# Patient Record
Sex: Male | Born: 1995
Health system: Southern US, Community
[De-identification: ages and names within clinical notes are randomized; demographics above are authoritative.]

## PROBLEM LIST (undated history)

## (undated) DIAGNOSIS — I10 Essential (primary) hypertension: Secondary | ICD-10-CM

## (undated) HISTORY — PX: TONSILLECTOMY: SUR1361

---
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
## 2016-05-17 ENCOUNTER — Emergency Department (HOSPITAL_COMMUNITY)
Admission: EM | Admit: 2016-05-17 | Discharge: 2016-05-17 | Disposition: A | Discharge status: Home / Self Care | Payer: 59 | Attending: Emergency Medicine | Admitting: Emergency Medicine

## 2016-05-17 ENCOUNTER — Encounter (HOSPITAL_COMMUNITY): Payer: Self-pay | Admitting: Cardiology

## 2016-05-17 ENCOUNTER — Emergency Department (HOSPITAL_COMMUNITY): Payer: 59

## 2016-05-17 DIAGNOSIS — Z79899 Other long term (current) drug therapy: Secondary | ICD-10-CM | POA: Diagnosis not present

## 2016-05-17 DIAGNOSIS — F17210 Nicotine dependence, cigarettes, uncomplicated: Secondary | ICD-10-CM | POA: Diagnosis not present

## 2016-05-17 DIAGNOSIS — J208 Acute bronchitis due to other specified organisms: Secondary | ICD-10-CM | POA: Insufficient documentation

## 2016-05-17 DIAGNOSIS — R109 Unspecified abdominal pain: Secondary | ICD-10-CM | POA: Insufficient documentation

## 2016-05-17 DIAGNOSIS — R079 Chest pain, unspecified: Secondary | ICD-10-CM | POA: Diagnosis not present

## 2016-05-17 DIAGNOSIS — I10 Essential (primary) hypertension: Secondary | ICD-10-CM | POA: Insufficient documentation

## 2016-05-17 LAB — CBC
HEMATOCRIT: 43.4 % (ref 39.0–52.0)
Hemoglobin: 15.7 g/dL (ref 13.0–17.0)
MCH: 32.5 pg (ref 26.0–34.0)
MCHC: 36.2 g/dL — ABNORMAL HIGH (ref 30.0–36.0)
MCV: 89.9 fL (ref 78.0–100.0)
Platelets: 186 10*3/uL (ref 150–400)
RBC: 4.83 MIL/uL (ref 4.22–5.81)
RDW: 12.2 % (ref 11.5–15.5)
WBC: 5.1 10*3/uL (ref 4.0–10.5)

## 2016-05-17 LAB — BASIC METABOLIC PANEL
Anion gap: 7 (ref 5–15)
BUN: 14 mg/dL (ref 6–20)
CHLORIDE: 104 mmol/L (ref 101–111)
CO2: 27 mmol/L (ref 22–32)
Calcium: 9.5 mg/dL (ref 8.9–10.3)
Creatinine, Ser: 0.86 mg/dL (ref 0.61–1.24)
GFR calc Af Amer: >60 mL/min (ref >60)
GFR calc non Af Amer: >60 mL/min (ref >60)
GLUCOSE: 101 mg/dL — AB (ref 65–99)
POTASSIUM: 3.8 mmol/L (ref 3.5–5.1)
SODIUM: 138 mmol/L (ref 135–145)

## 2016-05-17 LAB — HEPATIC FUNCTION PANEL
ALK PHOS: 76 U/L (ref 38–126)
ALT: 34 U/L (ref 17–63)
AST: 29 U/L (ref 15–41)
Albumin: 4.6 g/dL (ref 3.5–5.0)
BILIRUBIN INDIRECT: 0.5 mg/dL (ref 0.3–0.9)
Bilirubin, Direct: 0.1 mg/dL (ref 0.1–0.5)
TOTAL PROTEIN: 7.6 g/dL (ref 6.5–8.1)
Total Bilirubin: 0.6 mg/dL (ref 0.3–1.2)

## 2016-05-17 LAB — I-STAT TROPONIN, ED: Troponin i, poc: 0 ng/mL (ref 0.00–0.08)

## 2016-05-17 LAB — LIPASE, BLOOD: Lipase: 19 U/L (ref 11–51)

## 2016-05-17 MED ORDER — ACETAMINOPHEN 325 MG PO TABS
650.0000 mg | ORAL_TABLET | Freq: Once | ORAL | Status: AC
  Administered 2016-05-17: 650 mg via ORAL
  Filled 2016-05-17: qty 2

## 2016-05-17 MED ORDER — HYDRALAZINE HCL 20 MG/ML IJ SOLN: 5.0000 mg | Freq: Once | INTRAMUSCULAR | Status: DC

## 2016-05-17 MED ORDER — AZITHROMYCIN 250 MG PO TABS: ORAL_TABLET | ORAL | 0 refills | Status: DC

## 2016-05-17 MED ORDER — BENZONATATE 100 MG PO CAPS: 200.0000 mg | ORAL_CAPSULE | Freq: Three times a day (TID) | ORAL | 0 refills | Status: DC | PRN

## 2016-05-17 NOTE — Discharge Instructions (Signed)
Take the medicines prescribed for your suspected acute bronchitis.  Tylenol and heating pad may help with your chest wall pain.  You need to take your blood pressure medication as it was dangerously elevated when you first arrived.  Please follow up with your doctor for a blood pressure recheck within 1 week.

## 2016-05-17 NOTE — ED Triage Notes (Signed)
Left sided chest pain,  And jaw pain.  Bilateral arm and leg pain.  Coughing.

## 2016-05-17 NOTE — ED Provider Notes (Signed)
[facility] Provider Note   CSN: [id number] Arrival date & time: 05/17/16  4098     History   Chief Complaint Chief Complaint  Patient presents with  . Generalized Body Aches  . Chest Pain    HPI Benjamin Chang is a 21 y.o. male presenting with chest pain associated with coughing up thick green to black sputum, but not bloody (works as a Psychologist, occupational) along with generalized body aches and nasal congestion since yesterday.  His chest pain is localized to the left chest and radiates into his neck and jaw and also radiates to his back.  It is worsened with cough and with palpation of his chest.  He denies sob, leg cramping or swelling.  No vomiting but endorses mild nausea and upper abdominal discomfort. He has had no medicines prior to arriving here.  He is a 1/4 ppd smoker.  He is supposed to take bp medication which he has not taken since last summer, took a dose of his losartan last night when his chest pain started.  The history is provided by the patient.    Past Medical History:  Diagnosis Date  . Hypertension     Patient Active Problem List   Diagnosis Date Noted  . Acute appendicitis with localized peritonitis 11/04/2015    Past Surgical History:  Procedure Laterality Date  . LAPAROSCOPIC APPENDECTOMY N/A 11/04/2015   Procedure: APPENDECTOMY LAPAROSCOPIC;  Surgeon: Ancil Linsey, MD;  Location: [facility] ORS;  Service: General;  Laterality: N/A;  . TONSILLECTOMY         Home Medications    Prior to Admission medications   Medication Sig Start Date End Date Taking? Authorizing Provider  amLODipine (NORVASC) 5 MG tablet Take 5 mg by mouth daily.   Yes Historical Provider, MD  losartan (COZAAR) 25 MG tablet Take 25 mg by mouth daily.   Yes Historical Provider, MD  azithromycin (ZITHROMAX Z-PAK) 250 MG tablet Take 2 tablets by mouth on day one followed by one tablet daily for 4 days. 05/17/16   Burgess Amor, PA-C  benzonatate (TESSALON) 100 MG capsule Take 2 capsules  (200 mg total) by mouth 3 (three) times daily as needed. 05/17/16   Burgess Amor, PA-C    Family History Family History  Problem Relation Age of Onset  . Heart disease Other     Social History Social History  Substance Use Topics  . Smoking status: Current Some Day Smoker    Packs/day: 0.25    Types: Cigarettes  . Smokeless tobacco: Never Used  . Alcohol use No     Allergies   Lisinopril   Review of Systems Review of Systems  Constitutional: Negative for fever.  HENT: Positive for congestion. Negative for sore throat.   Eyes: Negative.   Respiratory: Positive for cough. Negative for chest tightness and shortness of breath.   Cardiovascular: Positive for chest pain.  Gastrointestinal: Positive for abdominal pain and nausea. Negative for constipation, diarrhea and vomiting.  Genitourinary: Negative.   Musculoskeletal: Positive for myalgias. Negative for arthralgias, joint swelling and neck pain.  Skin: Negative.  Negative for rash and wound.  Neurological: Negative for dizziness, weakness, light-headedness, numbness and headaches.  Psychiatric/Behavioral: Negative.      Physical Exam Updated Vital Signs BP (!) 151/100   Pulse 73   Temp 98.2 F (36.8 C) (Oral)   Resp 16   Ht  (1.905 m)   Wt 102.1 kg   SpO2 97%   BMI 28.12 kg/m  Physical Exam  Constitutional: He appears well-developed and well-nourished.  HENT:  Head: Normocephalic and atraumatic.  Eyes: Conjunctivae are normal.  Neck: Normal range of motion.  Cardiovascular: Normal rate, regular rhythm, normal heart sounds and intact distal pulses.  Exam reveals no friction rub.   No murmur heard. No ankle or calf edema.  nontender.  Pulmonary/Chest: Effort normal and breath sounds normal. He has no decreased breath sounds. He has no wheezes. He has no rhonchi. He has no rales. He exhibits tenderness.    ttp left chest wall low, mid clavicular line.    Abdominal: Soft. Bowel sounds are normal. There  is no tenderness.  Musculoskeletal: Normal range of motion.  Neurological: He is alert.  Skin: Skin is warm and dry.  Psychiatric: He has a normal mood and affect.  Nursing note and vitals reviewed.    ED Treatments / Results  Labs (all labs ordered are listed, but only abnormal results are displayed) Labs Reviewed  BASIC METABOLIC PANEL - Abnormal; Notable for the following:       Result Value   Glucose, Bld 101 (*)    All other components within normal limits  CBC - Abnormal; Notable for the following:    MCHC 36.2 (*)    All other components within normal limits  HEPATIC FUNCTION PANEL  LIPASE, BLOOD  I-STAT TROPOININ, ED    EKG  EKG Interpretation None      ED ECG REPORT   Date: 05/17/2016  Rate: 87  Rhythm: normal sinus rhythm  QRS Axis: normal  Intervals: normal  ST/T Wave abnormalities: normal  Conduction Disutrbances:none  Narrative Interpretation:   Old EKG Reviewed: unchanged  I have personally reviewed the EKG tracing and agree with the computerized printout as noted.   Radiology Dg Chest 2 View  Result Date: 05/17/2016 CLINICAL DATA:  Left-sided chest pain EXAM: CHEST  2 VIEW COMPARISON:  03/01/2015 FINDINGS: The heart size and mediastinal contours are within normal limits. Both lungs are clear. The visualized skeletal structures are unremarkable. IMPRESSION: No active cardiopulmonary disease. Electronically Signed   By: Alcide Clever M.D.   On: 05/17/2016 08:13    Procedures Procedures (including critical care time)  Medications Ordered in ED Medications  acetaminophen (TYLENOL) tablet 650 mg (650 mg Oral Given 05/17/16 0856)     Initial Impression / Assessment and Plan / ED Course  I have reviewed the triage vital signs and the nursing notes.  Pertinent labs & imaging results that were available during my care of the patient were reviewed by me and considered in my medical decision making (see chart for details).     Pt with left sided  chest wall pain along with cough and myalgias.  HTN, noncompliant with meds.  ekg normal, troponin negative.  Suspect acute bronchitis in setting of uncontrolled HTN.   He was placed on tessalon, zithromax for bronchitis.  He was strongly encouraged to not skip his doses of his bp medicine, discussed risks of uncontrolled bp.    Final Clinical Impressions(s) / ED Diagnoses   Final diagnoses:  Acute bronchitis due to other specified organisms  Essential hypertension    New Prescriptions New Prescriptions   AZITHROMYCIN (ZITHROMAX Z-PAK) 250 MG TABLET    Take 2 tablets by mouth on day one followed by one tablet daily for 4 days.   BENZONATATE (TESSALON) 100 MG CAPSULE    Take 2 capsules (200 mg total) by mouth 3 (three) times daily as needed.  Burgess Amor, PA-C 05/17/16 1011    Bethann Berkshire, MD 05/17/16 [phone]

## 2016-05-17 NOTE — ED Notes (Signed)
Pt states he feels better at this time.  b/p 151/100.  Per MD ok to hold hydralzine.

## 2016-05-19 DIAGNOSIS — I10 Essential (primary) hypertension: Secondary | ICD-10-CM | POA: Diagnosis not present

## 2016-05-19 DIAGNOSIS — J309 Allergic rhinitis, unspecified: Secondary | ICD-10-CM | POA: Diagnosis not present

## 2016-05-19 DIAGNOSIS — R05 Cough: Secondary | ICD-10-CM | POA: Diagnosis not present

## 2016-07-21 MED FILL — LOSARTAN-HCTZ 100-25 MG TAB: 100-25 | 90 days supply | Qty: 90 | Fill #0

## 2016-07-21 MED FILL — AMLODIPINE BESYLATE 10 MG T: 10 | 90 days supply | Qty: 90 | Fill #0

## 2016-07-31 ENCOUNTER — Ambulatory Visit (INDEPENDENT_AMBULATORY_CARE_PROVIDER_SITE_OTHER): Payer: 59 | Admitting: Otolaryngology

## 2016-07-31 DIAGNOSIS — H6983 Other specified disorders of Eustachian tube, bilateral: Secondary | ICD-10-CM | POA: Diagnosis not present

## 2016-08-17 DIAGNOSIS — S9031XA Contusion of right foot, initial encounter: Secondary | ICD-10-CM | POA: Diagnosis not present

## 2016-08-17 DIAGNOSIS — Z68.41 Body mass index (BMI) pediatric, 85th percentile to less than 95th percentile for age: Secondary | ICD-10-CM | POA: Diagnosis not present

## 2016-09-23 DIAGNOSIS — R05 Cough: Secondary | ICD-10-CM | POA: Diagnosis not present

## 2016-09-23 DIAGNOSIS — R079 Chest pain, unspecified: Secondary | ICD-10-CM | POA: Diagnosis not present

## 2016-09-23 DIAGNOSIS — J069 Acute upper respiratory infection, unspecified: Secondary | ICD-10-CM | POA: Diagnosis not present

## 2016-10-24 ENCOUNTER — Encounter: Payer: Self-pay | Admitting: *Deleted

## 2016-10-25 ENCOUNTER — Ambulatory Visit (INDEPENDENT_AMBULATORY_CARE_PROVIDER_SITE_OTHER): Payer: 59 | Admitting: Cardiovascular Disease

## 2016-10-25 ENCOUNTER — Encounter: Payer: Self-pay | Admitting: Cardiovascular Disease

## 2016-10-25 VITALS — BP 148/98 | HR 78 | Ht 75.0 in | Wt 218.0 lb

## 2016-10-25 DIAGNOSIS — R61 Generalized hyperhidrosis: Secondary | ICD-10-CM

## 2016-10-25 DIAGNOSIS — R079 Chest pain, unspecified: Secondary | ICD-10-CM

## 2016-10-25 DIAGNOSIS — I10 Essential (primary) hypertension: Secondary | ICD-10-CM | POA: Diagnosis not present

## 2016-10-25 DIAGNOSIS — R002 Palpitations: Secondary | ICD-10-CM | POA: Diagnosis not present

## 2016-10-25 NOTE — Patient Instructions (Signed)
Medication Instructions:  Continue all current medications.  Labwork: BMET, plasma aldosterone renin ratio, plasma metanephrines - orders given today.  Testing/Procedures:  CT angio of chest with contrast   Your physician has requested that you have an echocardiogram. Echocardiography is a painless test that uses sound waves to create images of your heart. It provides your doctor with information about the size and shape of your heart and how well your heart's chambers and valves are working. This procedure takes approximately one hour. There are no restrictions for this procedure.  Follow-Up:  Office will contact with results via phone or letter.    6 weeks   Any Other Special Instructions Will Be Listed Below (If Applicable).  If you need a refill on your cardiac medications before your next appointment, please call your pharmacy.

## 2016-10-25 NOTE — Progress Notes (Signed)
CARDIOLOGY CONSULT NOTE  Patient ID: Benjamin Chang MRN: [id number] DOB/AGE: 1995-02-11 21 y.o.  Admit date: (Not on file) Primary Physician: Selinda Flavin, MD Referring Physician: Dimas Aguas  Reason for Consultation: chest pain  HPI: Benjamin Chang is a 21 y.o. male who is being seen today for the evaluation of chest pain at the request of Selinda Flavin, MD.   He has a history of hypertension and GERD.  I reviewed PCP notes. It appears he has been treated for hypertension for several years. He has been reporting chest pain for the past few weeks and was apparently also evaluated in April 2018 at [facility] for chest pain. It was nonexertional. PCP performed workup which included a chest x-ray, CBC, and d-dimer, as well as basic metabolic panel and all were normal. ECG was also normal. He was started on omeprazole.  Echocardiogram 09/30/14 demonstrated normal left ventricular systolic function and normal diastolic function, LVEF 60-65%.  I personally reviewed the ECG performed on 05/17/16 which was normal.  Chest x-ray was also normal.  His chest pain in April 2018 was associated with coughing and productive sputum. He works as a Psychologist, occupational. It was presumed he had acute bronchitis and was given antibiotics.  Basic metabolic, CBC, hepatic function panel, lipase, and i-STAT troponin were normal.  He is here with his mother who works in the cancer center at [facility] and also works in Dr. Alonza Smoker office.  He was apparently diagnosed with hypertension during a school physical before he entered the ninth grade. He has had recalcitrant hypertension ever since. His mother tells me lisinopril controlled his blood pressures but he developed angioedema. He is currently on a calcium channel blocker, angiotensin receptor blocker, and thiazide diuretic.  He has been experiencing chest pain over the last several months. It can occur both with and without exertion. The most  severe instance occurred when he was at rest. He said it brought him to his knees and he was in tears. It radiates to the left shoulder and left arm and left side of his neck.  He has a history of palpitations associated with diaphoresis.  He denies leg swelling, orthopnea, and syncope.  He is also had headaches since the ninth grade.  He has an older brother who does not have hypertension. Hypertension does apparently run in his family.   Allergies  Allergen Reactions  . Lisinopril     Lips swell    Current Outpatient Prescriptions  Medication Sig Dispense Refill  . amLODipine (NORVASC) 10 MG tablet Take 10 mg by mouth daily.    . cetirizine (ZYRTEC) 10 MG tablet Take 10 mg by mouth daily.    Marland Kitchen losartan-hydrochlorothiazide (HYZAAR) 100-25 MG tablet Take 1 tablet by mouth daily.    Marland Kitchen omeprazole (PRILOSEC) 20 MG capsule Take 20 mg by mouth daily.    Marland Kitchen UNABLE TO FIND Med Name:HBP SUPPORT     No current facility-administered medications for this visit.     Past Medical History:  Diagnosis Date  . Hypertension     Past Surgical History:  Procedure Laterality Date  . LAPAROSCOPIC APPENDECTOMY N/A 11/04/2015   Procedure: APPENDECTOMY LAPAROSCOPIC;  Surgeon: Ancil Linsey, MD;  Location: [facility];  Service: General;  Laterality: N/A;  . TONSILLECTOMY      Social History   Social History  . Marital status: Single    Spouse name: N/A  . Number of children: N/A  . Years of  education: N/A   Occupational History  . Construction    Social History Main Topics  . Smoking status: Current Some Day Smoker    Packs/day: 0.25    Types: Cigarettes  . Smokeless tobacco: Never Used  . Alcohol use No  . Drug use: No  . Sexual activity: Not on file   Other Topics Concern  . Not on file   Social History Narrative   ** Merged History Encounter **         No family history of premature CAD in 1st degree relatives.  Current Meds  Medication Sig  . amLODipine (NORVASC) 10  MG tablet Take 10 mg by mouth daily.  . cetirizine (ZYRTEC) 10 MG tablet Take 10 mg by mouth daily.  Marland Kitchen losartan-hydrochlorothiazide (HYZAAR) 100-25 MG tablet Take 1 tablet by mouth daily.  Marland Kitchen omeprazole (PRILOSEC) 20 MG capsule Take 20 mg by mouth daily.  Marland Kitchen UNABLE TO FIND Med Name:HBP SUPPORT  . [DISCONTINUED] amLODipine (NORVASC) 5 MG tablet Take 5 mg by mouth daily.      Review of systems complete and found to be negative unless listed above in HPI    Physical exam Blood pressure (!) 148/98, pulse 78, height  (1.905 m), weight 218 lb (98.9 kg), SpO2 98 %. General: NAD Neck: No JVD, no thyromegaly or thyroid nodule.  Lungs: Clear to auscultation bilaterally with normal respiratory effort. CV: Nondisplaced PMI. Regular rate and rhythm, normal S1/S2, no S3/S4, no murmur.  No peripheral edema.  No carotid bruit.    Abdomen: Soft, nontender, no distention.  Skin: Intact without lesions or rashes.  Neurologic: Alert and oriented x 3.  Psych: Normal affect. Extremities: No clubbing or cyanosis.  HEENT: Normal.   ECG: Most recent ECG reviewed.   Labs: Lab Results  Component Value Date/Time   K 3.8 05/17/2016 07:51 AM   BUN 14 05/17/2016 07:51 AM   CREATININE 0.86 05/17/2016 07:51 AM   ALT 34 05/17/2016 07:51 AM   HGB 15.7 05/17/2016 07:51 AM     Lipids: No results found for: LDLCALC, LDLDIRECT, CHOL, TRIG, HDL      ASSESSMENT AND PLAN:  1. Accelerated hypertension with a history of chest pain, palpitations, headaches, and diaphoresis: While hypertension can be seen in young teenagers, it is still unusual. Given his history of associated headaches, palpitations, and diaphoresis, I want to screen for hyperaldosteronism and pheochromocytoma. I will obtain plasma aldosterone and plasma renin ratio and plasma metanephrines. I would consider renal artery Dopplers to evaluate for renal artery stenosis in the future.  2. Chest pain: I find no obvious abnormalities on physical  examination. His pretest probability of ischemic heart disease is exceedingly low. I would like to screen for arterial abnormalities such as coarctation of the aorta which would also cause hypertension. I will obtain a CT angiogram of the chest. I will also obtain an echocardiogram to evaluate cardiac structure and function. His pain may be due to episodes of severe hypertension, for which I am obtaining the studies mentioned above.     Disposition: Follow up in 6 weeks.  Signed: Prentice Docker, M.D., F.A.C.C.  10/25/2016, 9:29 AM

## 2016-10-31 ENCOUNTER — Other Ambulatory Visit (HOSPITAL_COMMUNITY)
Admission: RE | Admit: 2016-10-31 | Discharge: 2016-10-31 | Disposition: A | Discharge status: Home / Self Care | Payer: 59 | Source: Ambulatory Visit | Attending: Cardiovascular Disease | Admitting: Cardiovascular Disease

## 2016-10-31 ENCOUNTER — Ambulatory Visit (HOSPITAL_COMMUNITY)
Admission: RE | Admit: 2016-10-31 | Discharge: 2016-10-31 | Disposition: A | Discharge status: Home / Self Care | Payer: 59 | Source: Ambulatory Visit | Attending: Cardiovascular Disease | Admitting: Cardiovascular Disease

## 2016-10-31 DIAGNOSIS — I200 Unstable angina: Secondary | ICD-10-CM | POA: Insufficient documentation

## 2016-10-31 DIAGNOSIS — I371 Nonrheumatic pulmonary valve insufficiency: Secondary | ICD-10-CM | POA: Diagnosis not present

## 2016-10-31 DIAGNOSIS — R079 Chest pain, unspecified: Secondary | ICD-10-CM | POA: Insufficient documentation

## 2016-10-31 DIAGNOSIS — F17200 Nicotine dependence, unspecified, uncomplicated: Secondary | ICD-10-CM | POA: Diagnosis not present

## 2016-10-31 DIAGNOSIS — I10 Essential (primary) hypertension: Secondary | ICD-10-CM | POA: Insufficient documentation

## 2016-10-31 DIAGNOSIS — K219 Gastro-esophageal reflux disease without esophagitis: Secondary | ICD-10-CM | POA: Diagnosis not present

## 2016-10-31 LAB — BASIC METABOLIC PANEL
ANION GAP: 12 (ref 5–15)
BUN: 15 mg/dL (ref 6–20)
CALCIUM: 9.5 mg/dL (ref 8.9–10.3)
CO2: 23 mmol/L (ref 22–32)
Chloride: 102 mmol/L (ref 101–111)
Creatinine, Ser: 1.01 mg/dL (ref 0.61–1.24)
GLUCOSE: 118 mg/dL — AB (ref 65–99)
POTASSIUM: 3.7 mmol/L (ref 3.5–5.1)
SODIUM: 137 mmol/L (ref 135–145)

## 2016-10-31 MED ORDER — IOPAMIDOL (ISOVUE-370) INJECTION 76%
100.0000 mL | Freq: Once | INTRAVENOUS | Status: AC | PRN
  Administered 2016-10-31: 100 mL via INTRAVENOUS

## 2016-10-31 MED FILL — OMEPRAZOLE 20 MG CAP: 20 | 90 days supply | Qty: 90 | Fill #0

## 2016-10-31 NOTE — Progress Notes (Signed)
*  PRELIMINARY RESULTS* Echocardiogram 2D Echocardiogram has been performed.  Jeryl Columbia 10/31/2016, 2:44 PM

## 2016-11-01 ENCOUNTER — Telehealth: Payer: Self-pay | Admitting: *Deleted

## 2016-11-01 NOTE — Telephone Encounter (Signed)
Notes recorded by Lesle Chris, LPN on 04/29/4008 at 10:31 AM EDT Patient notified. Copy to pmd. Follow up scheduled for 12-08-2016 with Dr. Purvis Sheffield in [place] office.

## 2016-11-01 NOTE — Telephone Encounter (Signed)
BMET -   Notes recorded by Laqueta Linden, MD on 10/31/2016 at 11:20 AM EDT Ok.  CT ANGIO -   Notes recorded by Laqueta Linden, MD on 11/01/2016 at 8:45 AM EDT Normal.  ECHO -   Notes recorded by Laqueta Linden, MD on 10/31/2016 at 3:06 PM EDT Normal cardiac function.

## 2016-11-02 LAB — METANEPHRINES, PLASMA
METANEPHRINE FREE: 39 pg/mL (ref 0–62)
Normetanephrine, Free: 94 pg/mL (ref 0–145)

## 2016-11-04 LAB — ALDOSTERONE + RENIN ACTIVITY W/ RATIO
ALDO / PRA Ratio: 1 (ref 0.0–30.0)
Aldosterone: 5.3 ng/dL (ref 0.0–30.0)
PRA LC/MS/MS: 5.218 ng/mL/hr (ref 0.167–5.380)

## 2016-11-08 ENCOUNTER — Telehealth: Payer: Self-pay | Admitting: *Deleted

## 2016-11-08 NOTE — Telephone Encounter (Signed)
Notes recorded by Lesle Chris, LPN on 16/02/958 at 5:01 PM EDT Patient notified. Copy to pmd. Follow up scheduled for 12/08/2016 in [place] office with Dr. Purvis Sheffield. ------  Notes recorded by Laqueta Linden, MD on 11/06/2016 at 8:17 AM EDT Within normal range.

## 2016-12-08 ENCOUNTER — Ambulatory Visit (INDEPENDENT_AMBULATORY_CARE_PROVIDER_SITE_OTHER): Payer: 59 | Admitting: Cardiovascular Disease

## 2016-12-08 ENCOUNTER — Encounter: Payer: Self-pay | Admitting: Cardiovascular Disease

## 2016-12-08 VITALS — BP 136/88 | HR 71 | Ht 79.0 in | Wt 225.0 lb

## 2016-12-08 DIAGNOSIS — R079 Chest pain, unspecified: Secondary | ICD-10-CM | POA: Diagnosis not present

## 2016-12-08 DIAGNOSIS — I10 Essential (primary) hypertension: Secondary | ICD-10-CM

## 2016-12-08 DIAGNOSIS — Z7182 Exercise counseling: Secondary | ICD-10-CM

## 2016-12-08 DIAGNOSIS — Z713 Dietary counseling and surveillance: Secondary | ICD-10-CM | POA: Diagnosis not present

## 2016-12-08 DIAGNOSIS — Z79899 Other long term (current) drug therapy: Secondary | ICD-10-CM | POA: Diagnosis not present

## 2016-12-08 DIAGNOSIS — R61 Generalized hyperhidrosis: Secondary | ICD-10-CM | POA: Diagnosis not present

## 2016-12-08 DIAGNOSIS — R002 Palpitations: Secondary | ICD-10-CM

## 2016-12-08 MED ORDER — SPIRONOLACTONE 25 MG PO TABS
12.5000 mg | ORAL_TABLET | Freq: Every day | ORAL | 3 refills | Status: DC
Start: 2016-12-08 — End: 2017-03-19

## 2016-12-08 MED FILL — SPIRONOLACTONE 25 MG TABLET: 25 | 90 days supply | Qty: 45 | Fill #0

## 2016-12-08 NOTE — Patient Instructions (Signed)
Your physician recommends that you schedule a follow-up appointment in:  3 months with Dr. Purvis SheffieldKoneswaran    START Spironolactone  12.5 mg daily   Get lab work :BMET in 1 week after starting medication    After 1 week of being on medication, start recording BP, 3 times a week on BP log provided for you. Please do this for a month and then return log to office for review     Thank you for choosing [facility] !

## 2016-12-08 NOTE — Progress Notes (Signed)
SUBJECTIVE: The patient returns for follow-up after undergoing cardiovascular testing performed for the evaluation of chest pain and accelerated hypertension.  CT angiogram of the chest showed no acute abnormalities.  Echocardiogram demonstrated normal left ventricular systolic function, diastolic function and regional wall motion, LVEF 60-65%.  There was mild to moderate LVH.  Plasma metanephrines and aldosterone/renin ratio were normal.  He continues to have constant chest pains.  It is not necessarily associated with exertion.  We talked about his diet and it appears that he eats quite a bit of salt.  He does not eat breakfast, eats fast food for lunch, and foods prepared at home the evening are sodium laden.  His mother corroborates this.  He does not exercise either.  He is here with his mother who works in the cancer center at [facility] and also works in Dr. Alonza Smoker office.   Review of Systems: As per "subjective", otherwise negative.  Allergies  Allergen Reactions  . Lisinopril     Lips swell    Current Outpatient Prescriptions  Medication Sig Dispense Refill  . amLODipine (NORVASC) 10 MG tablet Take 10 mg by mouth daily.    . cetirizine (ZYRTEC) 10 MG tablet Take 10 mg by mouth daily.    Marland Kitchen losartan-hydrochlorothiazide (HYZAAR) 100-25 MG tablet Take 1 tablet by mouth daily.    Marland Kitchen omeprazole (PRILOSEC) 20 MG capsule Take 20 mg by mouth daily.    Marland Kitchen UNABLE TO FIND Med Name:HBP SUPPORT     No current facility-administered medications for this visit.     Past Medical History:  Diagnosis Date  . Hypertension     Past Surgical History:  Procedure Laterality Date  . LAPAROSCOPIC APPENDECTOMY N/A 11/04/2015   Procedure: APPENDECTOMY LAPAROSCOPIC;  Surgeon: Ancil Linsey, MD;  Location: [facility];  Service: General;  Laterality: N/A;  . TONSILLECTOMY      Social History   Social History  . Marital status: Single    Spouse name: N/A  . Number of  children: N/A  . Years of education: N/A   Occupational History  . Construction    Social History Main Topics  . Smoking status: Current Some Day Smoker    Packs/day: 0.25    Types: Cigarettes  . Smokeless tobacco: Never Used  . Alcohol use No  . Drug use: No  . Sexual activity: Not on file   Other Topics Concern  . Not on file   Social History Narrative   ** Merged History Encounter **         Vitals:   12/08/16 1445  BP: 136/88  Pulse: 71  SpO2: 97%  Weight: 225 lb (102.1 kg)  Height: 6\' 7"  (2.007 m)    Wt Readings from Last 3 Encounters:  12/08/16 225 lb (102.1 kg)  10/25/16 218 lb (98.9 kg)  05/17/16 225 lb (102.1 kg) (98 %, Z= 1.97)*   * Growth percentiles are based on CDC 2-20 Years data.     PHYSICAL EXAM General: NAD HEENT: Normal. Neck: No JVD, no thyromegaly. Lungs: Clear to auscultation bilaterally with normal respiratory effort. CV: Regular rate and rhythm, normal S1/S2, no S3/S4, no murmur. No pretibial or periankle edema.  Abdomen: Soft, nontender, no distention.  Neurologic: Alert and oriented.  Psych: Normal affect. Skin: Normal. Musculoskeletal: No gross deformities.    ECG: Most recent ECG reviewed.   Labs: Lab Results  Component Value Date/Time   K 3.7 10/31/2016 10:10 AM   BUN 15  10/31/2016 10:10 AM   CREATININE 1.01 10/31/2016 10:10 AM   ALT 34 05/17/2016 07:51 AM   HGB 15.7 05/17/2016 07:51 AM     Lipids: No results found for: LDLCALC, LDLDIRECT, CHOL, TRIG, HDL     ASSESSMENT AND PLAN:  1. Accelerated hypertension with a history of chest pain, palpitations, headaches, and diaphoresis: While hypertension can be seen in young teenagers, it is still unusual.   That being said, plasma aldosterone and plasma renin ratio and plasma metanephrines were normal.  I would consider renal artery Dopplers to evaluate for renal artery stenosis in the future. For now, I will start low-dose spironolactone 12.5 mg daily.  We will check  a basic metabolic panel in 1 week. I have asked the patient to check blood pressure readings 3-4 times per week, at different times throughout the day, in order to get a better approximation of mean BP values. These results will be provided to me at the end of that period so that I can determine if antihypertensive medication titration is indicated. I also educated him on the DASH diet.  I encouraged at least 150 minutes of moderate exercise weekly.  2. Chest pain: I find no obvious abnormalities on physical examination. His pretest probability of ischemic heart disease is exceedingly low. CT angiogram of the chest showed no evidence of acute abnormalities.  Symptoms are likely due to accelerated hypertension.  See #1.     Disposition: Follow up 3 months.   Prentice DockerSuresh Ezmeralda Stefanick, M.D., F.A.C.C.

## 2016-12-11 ENCOUNTER — Ambulatory Visit: Payer: 59 | Admitting: Cardiovascular Disease

## 2016-12-11 MED FILL — LOSARTAN-HCTZ 100-25 MG TAB: 100-25 | 90 days supply | Qty: 90 | Fill #0

## 2016-12-11 MED FILL — AMLODIPINE BESYLATE 10 MG T: 10 | 90 days supply | Qty: 90 | Fill #0

## 2017-03-19 ENCOUNTER — Ambulatory Visit (INDEPENDENT_AMBULATORY_CARE_PROVIDER_SITE_OTHER): Payer: 59 | Admitting: Cardiovascular Disease

## 2017-03-19 ENCOUNTER — Ambulatory Visit: Payer: 59 | Admitting: Cardiovascular Disease

## 2017-03-19 ENCOUNTER — Encounter: Payer: Self-pay | Admitting: Cardiovascular Disease

## 2017-03-19 ENCOUNTER — Other Ambulatory Visit (HOSPITAL_COMMUNITY)
Admission: RE | Admit: 2017-03-19 | Discharge: 2017-03-19 | Disposition: A | Discharge status: Home / Self Care | Payer: 59 | Source: Ambulatory Visit | Attending: Cardiovascular Disease | Admitting: Cardiovascular Disease

## 2017-03-19 VITALS — BP 148/100 | HR 100 | Ht 75.0 in | Wt 224.0 lb

## 2017-03-19 DIAGNOSIS — I10 Essential (primary) hypertension: Secondary | ICD-10-CM | POA: Insufficient documentation

## 2017-03-19 DIAGNOSIS — R079 Chest pain, unspecified: Secondary | ICD-10-CM

## 2017-03-19 DIAGNOSIS — Z79899 Other long term (current) drug therapy: Secondary | ICD-10-CM | POA: Diagnosis not present

## 2017-03-19 LAB — BASIC METABOLIC PANEL
Anion gap: 11 (ref 5–15)
BUN: 14 mg/dL (ref 6–20)
CO2: 26 mmol/L (ref 22–32)
Calcium: 10.1 mg/dL (ref 8.9–10.3)
Chloride: 101 mmol/L (ref 101–111)
Creatinine, Ser: 0.84 mg/dL (ref 0.61–1.24)
GFR calc Af Amer: >60 mL/min (ref >60)
Glucose, Bld: 105 mg/dL — ABNORMAL HIGH (ref 65–99)
Potassium: 4.1 mmol/L (ref 3.5–5.1)
SODIUM: 138 mmol/L (ref 135–145)

## 2017-03-19 MED ORDER — SPIRONOLACTONE 25 MG PO TABS: 25.0000 mg | ORAL_TABLET | Freq: Every day | ORAL | 3 refills | Status: DC

## 2017-03-19 MED ORDER — CARVEDILOL 3.125 MG PO TABS: 3.1250 mg | ORAL_TABLET | Freq: Two times a day (BID) | ORAL | 3 refills | Status: DC

## 2017-03-19 MED FILL — SPIRONOLACTONE 25 MG TABLET: 25 | 90 days supply | Qty: 90 | Fill #0

## 2017-03-19 MED FILL — CARVEDILOL 3.125 MG TABLET: 3.125 | 90 days supply | Qty: 180 | Fill #0

## 2017-03-19 NOTE — Progress Notes (Signed)
SUBJECTIVE: The patient presents for follow-up of accelerated hypertension and chest pain. I started low-dose spironolactone 12.5 mg daily at his last visit.  We also talked about the importance of a low-sodium diet and adequate amounts of moderate exercise on a weekly basis.  He had been checking his blood pressures but forgot to bring in his log.  He was doing extensive amounts of walking during the last week end of hunting season in early January and passed out for a few seconds.  He said his blood pressure was 160/110.  He only has exertional chest pain if he does extensive amounts of walking and has similar issues with exertional dyspnea.  He has constant headaches.  He has cut back on sodium use and avoids eating out.  He does not add salt to foods.  He rinses canned vegetables.   Social history: His mother works in the cancer center at [facility] and also works in Dr. Alonza SmokerFagan's office.  Review of Systems: As per "subjective", otherwise negative.  Allergies  Allergen Reactions  . Lisinopril     Lips swell    Current Outpatient Medications  Medication Sig Dispense Refill  . amLODipine (NORVASC) 10 MG tablet Take 10 mg by mouth daily.    . cetirizine (ZYRTEC) 10 MG tablet Take 10 mg by mouth daily.    Marland Kitchen. losartan-hydrochlorothiazide (HYZAAR) 100-25 MG tablet Take 1 tablet by mouth daily.    Marland Kitchen. omeprazole (PRILOSEC) 20 MG capsule Take 20 mg by mouth daily.    Marland Kitchen. spironolactone (ALDACTONE) 25 MG tablet Take 0.5 tablets (12.5 mg total) by mouth daily. 90 tablet 3  . UNABLE TO FIND Med Name:HBP SUPPORT     No current facility-administered medications for this visit.     Past Medical History:  Diagnosis Date  . Hypertension     Past Surgical History:  Procedure Laterality Date  . LAPAROSCOPIC APPENDECTOMY N/A 11/04/2015   Procedure: APPENDECTOMY LAPAROSCOPIC;  Surgeon: Ancil LinseyJason Evan Davis, MD;  Location: [facility];  Service: General;  Laterality: N/A;  . TONSILLECTOMY        Social History   Socioeconomic History  . Marital status: Single    Spouse name: Not on file  . Number of children: Not on file  . Years of education: Not on file  . Highest education level: Not on file  Social Needs  . Financial resource strain: Not on file  . Food insecurity - worry: Not on file  . Food insecurity - inability: Not on file  . Transportation needs - medical: Not on file  . Transportation needs - non-medical: Not on file  Occupational History  . Occupation: Holiday representativeConstruction  Tobacco Use  . Smoking status: Current Some Day Smoker    Packs/day: 0.25    Types: Cigarettes  . Smokeless tobacco: Never Used  Substance and Sexual Activity  . Alcohol use: No  . Drug use: No  . Sexual activity: Not on file  Other Topics Concern  . Not on file  Social History Narrative   ** Merged History Encounter **         Vitals:   03/19/17 1448  BP: (!) 148/100  Pulse: 100  SpO2: 98%  Weight: 224 lb (101.6 kg)  Height: 6\' 3"  (1.905 m)    Wt Readings from Last 3 Encounters:  03/19/17 224 lb (101.6 kg)  12/08/16 225 lb (102.1 kg)  10/25/16 218 lb (98.9 kg)     PHYSICAL EXAM General: NAD HEENT:  Normal. Neck: No JVD, no thyromegaly. Lungs: Clear to auscultation bilaterally with normal respiratory effort. CV: Regular rate and rhythm, normal S1/S2, no S3/S4, no murmur. No pretibial or periankle edema.   Abdomen: Soft, nontender, no distention.  Neurologic: Alert and oriented.  Psych: Normal affect. Skin: Normal. Musculoskeletal: No gross deformities.    ECG: Most recent ECG reviewed.   Labs: Lab Results  Component Value Date/Time   K 3.7 10/31/2016 10:10 AM   BUN 15 10/31/2016 10:10 AM   CREATININE 1.01 10/31/2016 10:10 AM   ALT 34 05/17/2016 07:51 AM   HGB 15.7 05/17/2016 07:51 AM     Lipids: No results found for: LDLCALC, LDLDIRECT, CHOL, TRIG, HDL     ASSESSMENT AND PLAN: 1.  Malignant hypertension: Blood pressure is elevated on current therapy  which includes amlodipine 10 mg, losartan 100 mg, hydrochlorothiazide 25 mg, and spironolactone 12.5 mg daily.  I will increase spironolactone to 25 mg daily and add carvedilol 3.125 mg twice daily.  I will obtain a basic metabolic panel.  I will again provide him with a blood pressure log and have asked him to check his blood pressure at least 3 times per week. I will also obtain renal artery Dopplers to evaluate for renal artery stenosis.  2.  Chest pain: No abnormalities on physical examination. His pretest probability of ischemic heart disease is exceedingly low. CT angiogram of the chest showed no evidence of acute abnormalities.  Symptoms are likely due to malignant hypertension.  See #1.      Disposition: Follow up 10 weeks   Prentice Docker, M.D., F.A.C.C.

## 2017-03-19 NOTE — Patient Instructions (Addendum)
Your physician recommends that you schedule a follow-up appointment in:10 weeks with Dr.Koneswaran   Your physician has requested that you have a renal artery duplex. During this test, an ultrasound is used to evaluate blood flow to the kidneys. Allow one hour for this exam. Do not eat after midnight the day before and avoid carbonated beverages. Take your medications as you usually do.    INCREASE Spironolactone to 25 mg daily   START Coreg 3.125 mg twice a day   Get lab work:BMET     I am giving you another blood pressure log    Thank you for choosing [facility] !

## 2017-03-27 MED FILL — OMEPRAZOLE 20 MG CAP: 20 | 60 days supply | Qty: 60 | Fill #1

## 2017-03-28 ENCOUNTER — Ambulatory Visit (INDEPENDENT_AMBULATORY_CARE_PROVIDER_SITE_OTHER): Payer: 59

## 2017-03-28 DIAGNOSIS — I10 Essential (primary) hypertension: Secondary | ICD-10-CM | POA: Diagnosis not present

## 2017-05-04 DIAGNOSIS — I10 Essential (primary) hypertension: Secondary | ICD-10-CM | POA: Diagnosis not present

## 2017-05-04 DIAGNOSIS — Z0000 Encounter for general adult medical examination without abnormal findings: Secondary | ICD-10-CM | POA: Diagnosis not present

## 2017-05-04 DIAGNOSIS — R079 Chest pain, unspecified: Secondary | ICD-10-CM | POA: Diagnosis not present

## 2017-05-04 DIAGNOSIS — Z68.41 Body mass index (BMI) pediatric, 85th percentile to less than 95th percentile for age: Secondary | ICD-10-CM | POA: Diagnosis not present

## 2017-05-15 MED FILL — LOSARTAN-HCTZ 100-25 MG TAB: 100-25 | 90 days supply | Qty: 90 | Fill #1

## 2017-05-15 MED FILL — AMLODIPINE BESYLATE 10 MG T: 10 | 90 days supply | Qty: 90 | Fill #1

## 2017-05-17 MED FILL — OMEPRAZOLE 20 MG CAP: 20 | 90 days supply | Qty: 90 | Fill #0

## 2017-05-28 ENCOUNTER — Encounter: Payer: Self-pay | Admitting: Cardiovascular Disease

## 2017-05-28 ENCOUNTER — Ambulatory Visit (INDEPENDENT_AMBULATORY_CARE_PROVIDER_SITE_OTHER): Payer: 59 | Admitting: Cardiovascular Disease

## 2017-05-28 VITALS — BP 164/67 | HR 103 | Ht 75.0 in | Wt 229.0 lb

## 2017-05-28 DIAGNOSIS — I10 Essential (primary) hypertension: Secondary | ICD-10-CM

## 2017-05-28 DIAGNOSIS — R079 Chest pain, unspecified: Secondary | ICD-10-CM

## 2017-05-28 MED ORDER — CARVEDILOL 6.25 MG PO TABS: 6.2500 mg | ORAL_TABLET | Freq: Two times a day (BID) | ORAL | 3 refills | Status: DC

## 2017-05-28 NOTE — Progress Notes (Signed)
SUBJECTIVE: The patient presents for follow-up of accelerated hypertension and chest pain.  There was no evidence of renal artery stenosis by Dopplers on 03/28/17.  He is here with his mother.  He is feeling well.  He is only had one episode of chest pain since his last visit with me which occurred about 2 weeks ago and spontaneously resolved.  He is able to work without any limitations of activities of daily living.  He has not had any significant side effects with respect to his medications.  He checks his blood pressure at home fairly routinely with systolic readings generally in the 130s and diastolic readings generally in the low 90s.  Social history: His mother works in the cancer center at [facility] and also works in Dr. Alonza SmokerFagan's office.   Review of Systems: As per "subjective", otherwise negative.  Allergies  Allergen Reactions  . Lisinopril     Lips swell    Current Outpatient Medications  Medication Sig Dispense Refill  . amLODipine (NORVASC) 10 MG tablet Take 10 mg by mouth daily.    . carvedilol (COREG) 3.125 MG tablet Take 1 tablet (3.125 mg total) by mouth 2 (two) times daily. 180 tablet 3  . cetirizine (ZYRTEC) 10 MG tablet Take 10 mg by mouth daily.    Marland Kitchen. losartan-hydrochlorothiazide (HYZAAR) 100-25 MG tablet Take 1 tablet by mouth daily.    Marland Kitchen. omeprazole (PRILOSEC) 20 MG capsule Take 20 mg by mouth daily.    Marland Kitchen. spironolactone (ALDACTONE) 25 MG tablet Take 1 tablet (25 mg total) by mouth daily. 90 tablet 3  . UNABLE TO FIND Med Name:HBP SUPPORT     No current facility-administered medications for this visit.     Past Medical History:  Diagnosis Date  . Hypertension     Past Surgical History:  Procedure Laterality Date  . LAPAROSCOPIC APPENDECTOMY N/A 11/04/2015   Procedure: APPENDECTOMY LAPAROSCOPIC;  Surgeon: Ancil LinseyJason Evan Davis, MD;  Location: [facility];  Service: General;  Laterality: N/A;  . TONSILLECTOMY      Social History   Socioeconomic  History  . Marital status: Single    Spouse name: Not on file  . Number of children: Not on file  . Years of education: Not on file  . Highest education level: Not on file  Occupational History  . Occupation: Training and development officerConstruction  Social Needs  . Financial resource strain: Not on file  . Food insecurity:    Worry: Not on file    Inability: Not on file  . Transportation needs:    Medical: Not on file    Non-medical: Not on file  Tobacco Use  . Smoking status: Current Some Day Smoker    Packs/day: 0.50    Types: Cigarettes  . Smokeless tobacco: Never Used  Substance and Sexual Activity  . Alcohol use: No  . Drug use: No  . Sexual activity: Not on file  Lifestyle  . Physical activity:    Days per week: Not on file    Minutes per session: Not on file  . Stress: Not on file  Relationships  . Social connections:    Talks on phone: Not on file    Gets together: Not on file    Attends religious service: Not on file    Active member of club or organization: Not on file    Attends meetings of clubs or organizations: Not on file    Relationship status: Not on file  . Intimate partner violence:  Fear of current or ex partner: Not on file    Emotionally abused: Not on file    Physically abused: Not on file    Forced sexual activity: Not on file  Other Topics Concern  . Not on file  Social History Narrative   ** Merged History Encounter **         Vitals:   05/28/17 0819  BP: (!) 164/67  Pulse: (!) 103  SpO2: 97%  Weight: 229 lb (103.9 kg)  Height: 6\' 3"  (1.905 m)    Wt Readings from Last 3 Encounters:  05/28/17 229 lb (103.9 kg)  03/19/17 224 lb (101.6 kg)  12/08/16 225 lb (102.1 kg)     PHYSICAL EXAM General: NAD HEENT: Normal. Neck: No JVD, no thyromegaly. Lungs: Clear to auscultation bilaterally with normal respiratory effort. CV: Regular rate and rhythm, normal S1/S2, no S3/S4, no murmur. No pretibial or periankle edema.  No carotid bruit.   Abdomen: Soft,  nontender, no distention.  Neurologic: Alert and oriented.  Psych: Normal affect. Skin: Normal. Musculoskeletal: No gross deformities.    ECG: Most recent ECG reviewed.   Labs: Lab Results  Component Value Date/Time   K 4.1 03/19/2017 03:22 PM   BUN 14 03/19/2017 03:22 PM   CREATININE 0.84 03/19/2017 03:22 PM   ALT 34 05/17/2016 07:51 AM   HGB 15.7 05/17/2016 07:51 AM     Lipids: No results found for: LDLCALC, LDLDIRECT, CHOL, TRIG, HDL     ASSESSMENT AND PLAN: 1.  Malignant hypertension: Blood pressure is elevated in our office today with generally well-controlled systolic readings at home and mildly elevated diastolic readings on current therapy which includes amlodipine 10 mg, losartan 100 mg, hydrochlorothiazide 25 mg, carvedilol 3.125 mg bid and spironolactone 25 mg daily. There was no evidence of renal artery stenosis by Dopplers on 03/28/17.  I will increase carvedilol to 6.25 mg twice daily.  2.  Chest pain: Only one episode since his last visit with me which quickly resolved.  No abnormalities on physical examination. His pretest probability of ischemic heart disease is exceedingly low. CT angiogram of the chest showed no evidence of acute abnormalities. Symptoms are likely due to malignant hypertension. See #1.      Disposition: Follow up 6 months   Prentice Docker, M.D., F.A.C.C.

## 2017-05-28 NOTE — Patient Instructions (Signed)
Your physician wants you to follow-up in: 6 months with Dr Reggy EyeKoneswaran You will receive a reminder letter in the mail two months in advance. If you don't receive a letter, please call our office to schedule the follow-up appointment.     INCREASE Coreg to 6.25 mg twice a day    If you need a refill on your cardiac medications before your next appointment, please call your pharmacy.      No testing ordered today      Thank you for choosing [facility] !

## 2017-07-16 ENCOUNTER — Other Ambulatory Visit: Payer: Self-pay | Admitting: Cardiovascular Disease

## 2017-07-16 MED ORDER — CARVEDILOL 6.25 MG PO TABS: 6.2500 mg | ORAL_TABLET | Freq: Two times a day (BID) | ORAL | 3 refills | Status: DC

## 2017-07-16 MED FILL — CARVEDILOL 6.25 MG TABLET: 6.25 | 90 days supply | Qty: 180 | Fill #0

## 2017-07-16 NOTE — Telephone Encounter (Signed)
Please send new RX for Carvedilol to [facility] / tg

## 2017-07-18 MED FILL — SPIRONOLACTONE 25 MG TABLET: 25 | 90 days supply | Qty: 90 | Fill #1

## 2017-09-18 MED FILL — LOSARTAN-HCTZ 100-25 MG TAB: 100-25 | 90 days supply | Qty: 90 | Fill #2

## 2017-09-18 MED FILL — AMLODIPINE BESYLATE 10 MG T: 10 | 90 days supply | Qty: 90 | Fill #2

## 2017-09-25 DIAGNOSIS — L209 Atopic dermatitis, unspecified: Secondary | ICD-10-CM | POA: Diagnosis not present

## 2017-09-25 DIAGNOSIS — H1033 Unspecified acute conjunctivitis, bilateral: Secondary | ICD-10-CM | POA: Diagnosis not present

## 2017-10-23 MED FILL — OMEPRAZOLE 20 MG CPDR: 20 | 90 days supply | Qty: 90 | Fill #1

## 2017-10-23 MED FILL — SPIRONOLACTONE 25 MG TABLET: 25 | 90 days supply | Qty: 90 | Fill #2

## 2017-11-09 DIAGNOSIS — I10 Essential (primary) hypertension: Secondary | ICD-10-CM | POA: Diagnosis not present

## 2017-11-09 DIAGNOSIS — E7800 Pure hypercholesterolemia, unspecified: Secondary | ICD-10-CM | POA: Diagnosis not present

## 2017-11-16 ENCOUNTER — Encounter (HOSPITAL_COMMUNITY): Payer: Self-pay | Admitting: Emergency Medicine

## 2017-11-16 ENCOUNTER — Emergency Department (HOSPITAL_COMMUNITY)
Admission: EM | Admit: 2017-11-16 | Discharge: 2017-11-16 | Disposition: A | Discharge status: Home / Self Care | Payer: 59 | Attending: Emergency Medicine | Admitting: Emergency Medicine

## 2017-11-16 ENCOUNTER — Emergency Department (HOSPITAL_COMMUNITY): Payer: 59

## 2017-11-16 ENCOUNTER — Other Ambulatory Visit: Payer: Self-pay

## 2017-11-16 DIAGNOSIS — R0789 Other chest pain: Secondary | ICD-10-CM | POA: Insufficient documentation

## 2017-11-16 DIAGNOSIS — R05 Cough: Secondary | ICD-10-CM | POA: Diagnosis not present

## 2017-11-16 DIAGNOSIS — R079 Chest pain, unspecified: Secondary | ICD-10-CM | POA: Diagnosis not present

## 2017-11-16 DIAGNOSIS — Z1331 Encounter for screening for depression: Secondary | ICD-10-CM | POA: Diagnosis not present

## 2017-11-16 DIAGNOSIS — Z1389 Encounter for screening for other disorder: Secondary | ICD-10-CM | POA: Diagnosis not present

## 2017-11-16 DIAGNOSIS — I10 Essential (primary) hypertension: Secondary | ICD-10-CM | POA: Insufficient documentation

## 2017-11-16 DIAGNOSIS — Z6828 Body mass index (BMI) 28.0-28.9, adult: Secondary | ICD-10-CM | POA: Diagnosis not present

## 2017-11-16 DIAGNOSIS — G43001 Migraine without aura, not intractable, with status migrainosus: Secondary | ICD-10-CM

## 2017-11-16 DIAGNOSIS — Z79899 Other long term (current) drug therapy: Secondary | ICD-10-CM | POA: Insufficient documentation

## 2017-11-16 DIAGNOSIS — F17210 Nicotine dependence, cigarettes, uncomplicated: Secondary | ICD-10-CM | POA: Insufficient documentation

## 2017-11-16 DIAGNOSIS — G43909 Migraine, unspecified, not intractable, without status migrainosus: Secondary | ICD-10-CM | POA: Diagnosis not present

## 2017-11-16 DIAGNOSIS — R0602 Shortness of breath: Secondary | ICD-10-CM | POA: Diagnosis not present

## 2017-11-16 LAB — BASIC METABOLIC PANEL
Anion gap: 8 (ref 5–15)
BUN: 11 mg/dL (ref 6–20)
CHLORIDE: 103 mmol/L (ref 98–111)
CO2: 26 mmol/L (ref 22–32)
Calcium: 8.9 mg/dL (ref 8.9–10.3)
Creatinine, Ser: 0.8 mg/dL (ref 0.61–1.24)
GFR calc Af Amer: >60 mL/min (ref >60)
GFR calc non Af Amer: >60 mL/min (ref >60)
Glucose, Bld: 97 mg/dL (ref 70–99)
POTASSIUM: 4 mmol/L (ref 3.5–5.1)
SODIUM: 137 mmol/L (ref 135–145)

## 2017-11-16 LAB — CBC
HEMATOCRIT: 38.7 % — AB (ref 39.0–52.0)
HEMOGLOBIN: 13.9 g/dL (ref 13.0–17.0)
MCH: 33.2 pg (ref 26.0–34.0)
MCHC: 35.9 g/dL (ref 30.0–36.0)
MCV: 92.4 fL (ref 80.0–100.0)
Platelets: 204 10*3/uL (ref 150–400)
RBC: 4.19 MIL/uL — ABNORMAL LOW (ref 4.22–5.81)
RDW: 12.2 % (ref 11.5–15.5)
WBC: 6.3 10*3/uL (ref 4.0–10.5)
nRBC: 0 % (ref 0.0–0.2)

## 2017-11-16 LAB — I-STAT TROPONIN, ED: Troponin i, poc: 0 ng/mL (ref 0.00–0.08)

## 2017-11-16 MED ORDER — TRAMADOL HCL 50 MG PO TABS: 50.0000 mg | ORAL_TABLET | Freq: Four times a day (QID) | ORAL | 0 refills | Status: DC | PRN

## 2017-11-16 MED ORDER — KETOROLAC TROMETHAMINE 30 MG/ML IJ SOLN
30.0000 mg | Freq: Once | INTRAMUSCULAR | Status: AC
  Administered 2017-11-16: 30 mg via INTRAVENOUS
  Filled 2017-11-16: qty 1

## 2017-11-16 MED ORDER — DIPHENHYDRAMINE HCL 50 MG/ML IJ SOLN
25.0000 mg | Freq: Once | INTRAMUSCULAR | Status: AC
  Administered 2017-11-16: 25 mg via INTRAVENOUS
  Filled 2017-11-16: qty 1

## 2017-11-16 MED ORDER — METOCLOPRAMIDE HCL 5 MG/ML IJ SOLN
10.0000 mg | Freq: Once | INTRAMUSCULAR | Status: AC
  Administered 2017-11-16: 10 mg via INTRAVENOUS
  Filled 2017-11-16: qty 2

## 2017-11-16 NOTE — ED Triage Notes (Signed)
Patient has migraine and chest pain that started today, has history of migraines, taken 2 tylenol for headache before leaving home.

## 2017-11-16 NOTE — Discharge Instructions (Addendum)
Follow-up with your doctor if any problem 

## 2017-11-16 NOTE — ED Provider Notes (Signed)
[facility] EMERGENCY DEPARTMENT Provider Note   CSN: [id number] Arrival date & time: 11/16/17  2116     History   Chief Complaint Chief Complaint  Patient presents with  . Chest Pain  . Migraine    HPI Benjamin Chang is a 22 y.o. male.  Patient complains of headache and chest wall pain.  Patient has a history of headaches  The history is provided by the patient. No language interpreter was used.  Illness  This is a new problem. The current episode started 12 to 24 hours ago. The problem occurs constantly. The problem has not changed since onset.Associated symptoms include chest pain and headaches. Pertinent negatives include no abdominal pain. Nothing aggravates the symptoms. Nothing relieves the symptoms. He has tried nothing for the symptoms. The treatment provided no relief.    Past Medical History:  Diagnosis Date  . Hypertension     Patient Active Problem List   Diagnosis Date Noted  . Acute appendicitis with localized peritonitis 11/04/2015    Past Surgical History:  Procedure Laterality Date  . LAPAROSCOPIC APPENDECTOMY N/A 11/04/2015   Procedure: APPENDECTOMY LAPAROSCOPIC;  Surgeon: Ancil Linsey, MD;  Location: [facility];  Service: General;  Laterality: N/A;  . TONSILLECTOMY          Home Medications    Prior to Admission medications   Medication Sig Start Date End Date Taking? Authorizing Provider  amLODipine (NORVASC) 10 MG tablet Take 10 mg by mouth daily.    [provider]  carvedilol (COREG) 6.25 MG tablet Take 1 tablet (6.25 mg total) by mouth 2 (two) times daily. 07/16/17 10/14/17  Laqueta Linden, MD  cetirizine (ZYRTEC) 10 MG tablet Take 10 mg by mouth daily.    [provider]  losartan-hydrochlorothiazide (HYZAAR) 100-25 MG tablet Take 1 tablet by mouth daily.    [provider]  omeprazole (PRILOSEC) 20 MG capsule Take 20 mg by mouth daily.    [provider]  spironolactone (ALDACTONE) 25 MG tablet  Take 1 tablet (25 mg total) by mouth daily. 03/19/17 06/17/17  Laqueta Linden, MD  traMADol (ULTRAM) 50 MG tablet Take 1 tablet (50 mg total) by mouth every 6 (six) hours as needed. 11/16/17   Bethann Berkshire, MD  UNABLE TO FIND Med Name:HBP SUPPORT    [provider]    Family History Family History  Problem Relation Age of Onset  . Heart disease Other   . Heart disease Maternal Grandmother   . Heart attack Paternal Grandfather   . Diabetes Paternal Grandfather   . Hypertension Paternal Grandfather     Social History Social History   Tobacco Use  . Smoking status: Current Some Day Smoker    Packs/day: 0.50    Types: Cigarettes  . Smokeless tobacco: Never Used  Substance Use Topics  . Alcohol use: No  . Drug use: No     Allergies   Lisinopril   Review of Systems Review of Systems  Constitutional: Negative for appetite change and fatigue.  HENT: Negative for congestion, ear discharge and sinus pressure.        Headache  Eyes: Negative for discharge.  Respiratory: Negative for cough.   Cardiovascular: Positive for chest pain.  Gastrointestinal: Negative for abdominal pain and diarrhea.  Genitourinary: Negative for frequency and hematuria.  Musculoskeletal: Negative for back pain.  Skin: Negative for rash.  Neurological: Positive for headaches. Negative for seizures.  Psychiatric/Behavioral: Negative for hallucinations.     Physical Exam Updated  Vital Signs BP (!) 133/97   Pulse 74   Temp 98.5 F (36.9 C) (Oral)   Resp 15   Ht 6\' 3"  (1.905 m)   Wt 99.8 kg   SpO2 94%   BMI 27.50 kg/m   Physical Exam  Constitutional: He is oriented to person, place, and time. He appears well-developed.  HENT:  Head: Normocephalic.  Eyes: Conjunctivae and EOM are normal. No scleral icterus.  Neck: Neck supple. No thyromegaly present.  Cardiovascular: Normal rate and regular rhythm. Exam reveals no gallop and no friction rub.  No murmur  heard. Pulmonary/Chest: No stridor. He has no wheezes. He has no rales. He exhibits tenderness.  Abdominal: He exhibits no distension. There is no tenderness. There is no rebound.  Musculoskeletal: Normal range of motion. He exhibits no edema.  Lymphadenopathy:    He has no cervical adenopathy.  Neurological: He is oriented to person, place, and time. He exhibits normal muscle tone. Coordination normal.  Skin: No rash noted. No erythema.  Psychiatric: He has a normal mood and affect. His behavior is normal.     ED Treatments / Results  Labs (all labs ordered are listed, but only abnormal results are displayed) Labs Reviewed  CBC - Abnormal; Notable for the following components:      Result Value   RBC 4.19 (*)    HCT 38.7 (*)    All other components within normal limits  BASIC METABOLIC PANEL  I-STAT TROPONIN, ED    EKG None  Radiology Dg Chest 2 View  Result Date: 11/16/2017 CLINICAL DATA:  Chest pain radiating to both arms, weakness, and shortness of breath for 1 hour. Cough. History of hypertension. Current smoker. EXAM: CHEST - 2 VIEW COMPARISON:  05/17/2016 FINDINGS: The heart size and mediastinal contours are within normal limits. Both lungs are clear. The visualized skeletal structures are unremarkable. IMPRESSION: No active cardiopulmonary disease. Electronically Signed   By: Burman Nieves M.D.   On: 11/16/2017 22:22    Procedures Procedures (including critical care time)  Medications Ordered in ED Medications  ketorolac (TORADOL) 30 MG/ML injection 30 mg (30 mg Intravenous Given 11/16/17 2208)  metoCLOPramide (REGLAN) injection 10 mg (10 mg Intravenous Given 11/16/17 2210)  diphenhydrAMINE (BENADRYL) injection 25 mg (25 mg Intravenous Given 11/16/17 2206)     Initial Impression / Assessment and Plan / ED Course  I have reviewed the triage vital signs and the nursing notes.  Pertinent labs & imaging results that were available during my care of the patient  were reviewed by me and considered in my medical decision making (see chart for details).    Labs unremarkable.  Patient improved with migraine cocktail.  Patient has a migraine and chest wall pain  Final Clinical Impressions(s) / ED Diagnoses   Final diagnoses:  Chest wall pain  Migraine without aura and with status migrainosus, not intractable    ED Discharge Orders         Ordered    traMADol (ULTRAM) 50 MG tablet  Every 6 hours PRN     11/16/17 2254           Bethann Berkshire, MD 11/16/17 2258

## 2017-11-27 MED FILL — CARVEDILOL 6.25 MG TABLET: 6.25 | 90 days supply | Qty: 180 | Fill #1

## 2018-01-08 ENCOUNTER — Other Ambulatory Visit: Payer: Self-pay | Admitting: Cardiovascular Disease

## 2018-01-08 MED ORDER — AMLODIPINE BESYLATE 10 MG PO TABS: 10.0000 mg | ORAL_TABLET | Freq: Every day | ORAL | 6 refills | Status: DC

## 2018-01-08 MED ORDER — LOSARTAN POTASSIUM-HCTZ 100-25 MG PO TABS: 1.0000 | ORAL_TABLET | Freq: Every day | ORAL | 6 refills | Status: DC

## 2018-01-08 MED FILL — AMLODIPINE BESYLATE 10 MG T: 10 | 30 days supply | Qty: 30 | Fill #0

## 2018-01-08 NOTE — Telephone Encounter (Signed)
amLODipine (NORVASC) 10 MG tablet [[id number]][[id number]]   losartan-hydrochlorothiazide (HYZAAR) 100-25 MG tablet [[id number]][[id number]]   Pt's PCP would like for Dr. Purvis SheffieldKoneswaran to take over filling these meds.   Pt is currently out and will need each one sent in to [facility] out pt pharmacy.

## 2018-01-08 NOTE — Telephone Encounter (Signed)
Refilled to [facility] pharmacy, pt has f/u apt scheduled

## 2018-01-09 ENCOUNTER — Other Ambulatory Visit: Payer: Self-pay | Admitting: *Deleted

## 2018-01-09 MED ORDER — LOSARTAN POTASSIUM 100 MG PO TABS: 100.0000 mg | ORAL_TABLET | Freq: Every day | ORAL | 0 refills | Status: DC

## 2018-01-09 MED ORDER — HYDROCHLOROTHIAZIDE 25 MG PO TABS: 25.0000 mg | ORAL_TABLET | Freq: Every day | ORAL | 3 refills | Status: DC

## 2018-01-09 MED FILL — HYDROCHLOROTHIAZIDE 25 MG T: 25 | 90 days supply | Qty: 90 | Fill #0

## 2018-01-09 MED FILL — LOSARTAN POTASSIUM 100 MG T: 100 | 30 days supply | Qty: 30 | Fill #0

## 2018-01-09 NOTE — Telephone Encounter (Signed)
Per pharmacy, hyzaar 100/25 mg is on manufacture back order and is needing prescriptions for losartan 100 mg and hctz 25 mg. Rx sent per request.

## 2018-01-25 ENCOUNTER — Ambulatory Visit: Payer: 59 | Admitting: Cardiovascular Disease

## 2018-01-28 ENCOUNTER — Encounter: Payer: Self-pay | Admitting: Cardiovascular Disease

## 2018-02-13 MED FILL — AMLODIPINE BESYLATE 10 MG T: 10 | 30 days supply | Qty: 30 | Fill #1

## 2018-02-13 MED FILL — OMEPRAZOLE 20 MG CPDR: 20 | 90 days supply | Qty: 90 | Fill #2

## 2018-02-27 ENCOUNTER — Other Ambulatory Visit: Payer: Self-pay | Admitting: Cardiovascular Disease

## 2018-02-27 MED FILL — LOSARTAN POTASSIUM 100 MG T: 100 | 90 days supply | Qty: 90 | Fill #0

## 2018-02-27 MED FILL — SPIRONOLACTONE 25 MG TABLET: 25 | 90 days supply | Qty: 90 | Fill #3

## 2018-03-08 DIAGNOSIS — R079 Chest pain, unspecified: Secondary | ICD-10-CM | POA: Diagnosis not present

## 2018-03-08 DIAGNOSIS — Z6829 Body mass index (BMI) 29.0-29.9, adult: Secondary | ICD-10-CM | POA: Diagnosis not present

## 2018-03-08 DIAGNOSIS — I10 Essential (primary) hypertension: Secondary | ICD-10-CM | POA: Diagnosis not present

## 2018-03-08 DIAGNOSIS — E782 Mixed hyperlipidemia: Secondary | ICD-10-CM | POA: Diagnosis not present

## 2018-03-08 DIAGNOSIS — R739 Hyperglycemia, unspecified: Secondary | ICD-10-CM | POA: Diagnosis not present

## 2018-04-03 MED FILL — AMLODIPINE BESYLATE 10 MG T: 10 | 30 days supply | Qty: 30 | Fill #2 | Status: TO

## 2018-04-12 ENCOUNTER — Ambulatory Visit (INDEPENDENT_AMBULATORY_CARE_PROVIDER_SITE_OTHER): Payer: 59 | Admitting: Cardiovascular Disease

## 2018-04-12 ENCOUNTER — Encounter: Payer: Self-pay | Admitting: Cardiovascular Disease

## 2018-04-12 VITALS — BP 145/90 | HR 72 | Ht 75.0 in | Wt 231.2 lb

## 2018-04-12 DIAGNOSIS — R079 Chest pain, unspecified: Secondary | ICD-10-CM

## 2018-04-12 DIAGNOSIS — I10 Essential (primary) hypertension: Secondary | ICD-10-CM | POA: Diagnosis not present

## 2018-04-12 MED ORDER — CLONIDINE HCL 0.1 MG PO TABS: 0.1000 mg | ORAL_TABLET | Freq: Every day | ORAL | 1 refills | Status: DC

## 2018-04-12 MED FILL — CloNIDine HCL 0.1 MG TAB: 0.1 | 90 days supply | Qty: 90 | Fill #0

## 2018-04-12 NOTE — Patient Instructions (Signed)
Your physician wants you to follow-up in: 1 YEAR WITH DR Reggy Eye will receive a reminder letter in the mail two months in advance. If you don't receive a letter, please call our office to schedule the follow-up appointment.  Your physician has recommended you make the following change in your medication:   START CLONIDINE 0.1 MG DAILY AT NIGHT  Thank you for choosing [facility]!!

## 2018-04-12 NOTE — Progress Notes (Signed)
SUBJECTIVE: The patient presents for follow-up of accelerated hypertension and chest pain.  There was no evidence of renal artery stenosis by Dopplers on 03/28/17.  He is doing well overall.  Blood pressures run 130-140/90 range at home.  He checks his blood pressure once per week.  He experiences chest pain once every 4 months but when these episodes do occur he says they are severe.  He seldom has headaches now.  He denies blurry vision.  He has some lightheadedness and dizziness only when standing up too quickly from a seated position.   Social history: His mother works in the cancer center at [employer] and also works in Dr. Alonza Smoker office.  Review of Systems: As per "subjective", otherwise negative.  Allergies  Allergen Reactions  . Lisinopril     Lips swell    Current Outpatient Medications  Medication Sig Dispense Refill  . amLODipine (NORVASC) 10 MG tablet Take 1 tablet (10 mg total) by mouth daily. 30 tablet 6  . carvedilol (COREG) 6.25 MG tablet Take 1 tablet (6.25 mg total) by mouth 2 (two) times daily. 180 tablet 3  . cetirizine (ZYRTEC) 10 MG tablet Take 10 mg by mouth daily.    . hydrochlorothiazide (HYDRODIURIL) 25 MG tablet Take 1 tablet (25 mg total) by mouth daily. 90 tablet 3  . losartan (COZAAR) 100 MG tablet TAKE 1 TABLET (100 MG TOTAL) BY MOUTH DAILY. 90 tablet 1  . omeprazole (PRILOSEC) 20 MG capsule Take 20 mg by mouth daily.    Marland Kitchen spironolactone (ALDACTONE) 25 MG tablet Take 1 tablet (25 mg total) by mouth daily. 90 tablet 3   No current facility-administered medications for this visit.     Past Medical History:  Diagnosis Date  . Hypertension     Past Surgical History:  Procedure Laterality Date  . LAPAROSCOPIC APPENDECTOMY N/A 11/04/2015   Procedure: APPENDECTOMY LAPAROSCOPIC;  Surgeon: Ancil Linsey, MD;  Location: [facility];  Service: General;  Laterality: N/A;  . TONSILLECTOMY      Social History   Socioeconomic History  .  Marital status: Single    Spouse name: Not on file  . Number of children: Not on file  . Years of education: Not on file  . Highest education level: Not on file  Occupational History  . Occupation: Training and development officer  . Financial resource strain: Not on file  . Food insecurity:    Worry: Not on file    Inability: Not on file  . Transportation needs:    Medical: Not on file    Non-medical: Not on file  Tobacco Use  . Smoking status: Current Some Day Smoker    Packs/day: 0.50    Types: Cigarettes  . Smokeless tobacco: Never Used  Substance and Sexual Activity  . Alcohol use: No  . Drug use: No  . Sexual activity: Not on file  Lifestyle  . Physical activity:    Days per week: Not on file    Minutes per session: Not on file  . Stress: Not on file  Relationships  . Social connections:    Talks on phone: Not on file    Gets together: Not on file    Attends religious service: Not on file    Active member of club or organization: Not on file    Attends meetings of clubs or organizations: Not on file    Relationship status: Not on file  . Intimate partner violence:  Fear of current or ex partner: Not on file    Emotionally abused: Not on file    Physically abused: Not on file    Forced sexual activity: Not on file  Other Topics Concern  . Not on file  Social History Narrative   ** Merged History Encounter **         Vitals:   04/12/18 1316  BP: (!) 145/90  Pulse: 72  SpO2: 98%  Weight: 231 lb 3.2 oz (104.9 kg)  Height: 6\' 3"  (1.905 m)    Wt Readings from Last 3 Encounters:  04/12/18 231 lb 3.2 oz (104.9 kg)  11/16/17 220 lb (99.8 kg)  05/28/17 229 lb (103.9 kg)     PHYSICAL EXAM General: NAD HEENT: Normal. Neck: No JVD, no thyromegaly. Lungs: Clear to auscultation bilaterally with normal respiratory effort. CV: Regular rate and rhythm, normal S1/S2, no S3/S4, no murmur. No pretibial or periankle edema.  No carotid bruit.   Abdomen: Soft,  nontender, no distention.  Neurologic: Alert and oriented.  Psych: Normal affect. Skin: Normal. Musculoskeletal: No gross deformities.    ECG: Reviewed above under Subjective   Labs: Lab Results  Component Value Date/Time   K 4.0 11/16/2017 10:01 PM   BUN 11 11/16/2017 10:01 PM   CREATININE 0.80 11/16/2017 10:01 PM   ALT 34 05/17/2016 07:51 AM   HGB 13.9 11/16/2017 10:01 PM     Lipids: No results found for: LDLCALC, LDLDIRECT, CHOL, TRIG, HDL     ASSESSMENT AND PLAN: 1.Acceleratedhypertension: Blood pressure is mildly elevated today on current therapy which includes amlodipine 10 mg, losartan 100 mg, hydrochlorothiazide 25 mg, carvedilol 6.25 mg bid and spironolactone 25 mg daily. There was no evidence of renal artery stenosis by Dopplers on 03/28/17.  I will add clonidine 0.1 mg every evening.  2. Chest pain: No abnormalities on physical examination. His pretest probability of ischemic heart disease is exceedingly low. CT angiogram of the chest showed no evidence of acute abnormalities. Symptoms are seldom now and likely due toacceleratedhypertension. I have asked him to check his blood pressure during these episodes.  See #1.     Disposition: Follow up 1 year   Prentice Docker, M.D., F.A.C.C.

## 2018-05-08 MED FILL — HYDROCHLOROTHIAZIDE 25 MG T: 25 | 90 days supply | Qty: 90 | Fill #0

## 2018-05-11 DIAGNOSIS — R05 Cough: Secondary | ICD-10-CM | POA: Diagnosis not present

## 2018-05-11 DIAGNOSIS — J309 Allergic rhinitis, unspecified: Secondary | ICD-10-CM | POA: Diagnosis not present

## 2018-05-13 ENCOUNTER — Other Ambulatory Visit: Payer: Self-pay | Admitting: Cardiovascular Disease

## 2018-05-13 MED FILL — CARVEDILOL 6.25 MG TABLET: 6.25 | 90 days supply | Qty: 180 | Fill #0

## 2018-05-13 MED FILL — AMLODIPINE BESYLATE 10 MG T: 10 | 30 days supply | Qty: 30 | Fill #0

## 2018-05-13 MED FILL — LOSARTAN POTASSIUM 100 MG T: 100 | 30 days supply | Qty: 30 | Fill #0

## 2018-05-13 MED FILL — OMEPRAZOLE 20 MG CPDR: 20 | 90 days supply | Qty: 90 | Fill #0

## 2018-05-20 MED FILL — SPIRONOLACTONE 25 MG TABS: 25 | 90 days supply | Qty: 90 | Fill #0

## 2018-07-29 MED FILL — LOSARTAN POTASSIUM 100 MG T: 100 | 30 days supply | Qty: 30 | Fill #0

## 2018-07-29 MED FILL — AMLODIPINE BESYLATE 10 MG T: 10 | 30 days supply | Qty: 30 | Fill #0

## 2018-08-26 DIAGNOSIS — I10 Essential (primary) hypertension: Secondary | ICD-10-CM | POA: Diagnosis not present

## 2018-08-26 DIAGNOSIS — E7800 Pure hypercholesterolemia, unspecified: Secondary | ICD-10-CM | POA: Diagnosis not present

## 2018-08-26 DIAGNOSIS — E782 Mixed hyperlipidemia: Secondary | ICD-10-CM | POA: Diagnosis not present

## 2018-08-26 DIAGNOSIS — R5381 Other malaise: Secondary | ICD-10-CM | POA: Diagnosis not present

## 2018-08-26 DIAGNOSIS — R739 Hyperglycemia, unspecified: Secondary | ICD-10-CM | POA: Diagnosis not present

## 2018-08-28 DIAGNOSIS — R739 Hyperglycemia, unspecified: Secondary | ICD-10-CM | POA: Diagnosis not present

## 2018-08-28 DIAGNOSIS — Z6828 Body mass index (BMI) 28.0-28.9, adult: Secondary | ICD-10-CM | POA: Diagnosis not present

## 2018-08-28 DIAGNOSIS — E782 Mixed hyperlipidemia: Secondary | ICD-10-CM | POA: Diagnosis not present

## 2018-08-28 DIAGNOSIS — R079 Chest pain, unspecified: Secondary | ICD-10-CM | POA: Diagnosis not present

## 2018-08-28 DIAGNOSIS — I10 Essential (primary) hypertension: Secondary | ICD-10-CM | POA: Diagnosis not present

## 2018-09-30 MED FILL — AMLODIPINE BESYLATE 10 MG T: 10 | 30 days supply | Qty: 30 | Fill #1

## 2018-10-10 MED FILL — HYDROCHLOROTHIAZIDE 25 MG T: 25 | 90 days supply | Qty: 90 | Fill #0

## 2018-10-10 MED FILL — LOSARTAN POTASSIUM 100 MG T: 100 | 30 days supply | Qty: 30 | Fill #1

## 2018-10-11 MED FILL — OMEPRAZOLE 20 MG CAPSULE DR: 20 | 30 days supply | Qty: 30 | Fill #0

## 2018-10-22 ENCOUNTER — Other Ambulatory Visit: Payer: Self-pay | Admitting: Cardiovascular Disease

## 2018-10-24 MED FILL — AMLODIPINE BESYLATE 10 MG T: 10 | 30 days supply | Qty: 30 | Fill #2

## 2018-11-12 ENCOUNTER — Other Ambulatory Visit: Payer: Self-pay | Admitting: Cardiovascular Disease

## 2018-11-18 MED FILL — AMLODIPINE BESYLATE 10 MG T: 10 | 90 days supply | Qty: 90 | Fill #0

## 2018-12-10 MED FILL — LOSARTAN POTASSIUM 100 MG T: 100 | 90 days supply | Qty: 90 | Fill #0

## 2018-12-10 MED FILL — OMEPRAZOLE 20 MG CAPSULE DR: 20 | 30 days supply | Qty: 30 | Fill #1

## 2019-01-27 MED FILL — OMEPRAZOLE 20 MG CAPSULE DR: 20 | 30 days supply | Qty: 30 | Fill #2

## 2019-02-24 ENCOUNTER — Other Ambulatory Visit: Payer: Self-pay | Admitting: Cardiovascular Disease

## 2019-02-24 MED FILL — HYDROCHLOROTHIAZIDE 25 MG T: 25 | 90 days supply | Qty: 90 | Fill #0

## 2019-02-24 MED FILL — OMEPRAZOLE DR 20 MG CAPSULE: 20 | 30 days supply | Qty: 30 | Fill #3

## 2019-03-04 DIAGNOSIS — E7800 Pure hypercholesterolemia, unspecified: Secondary | ICD-10-CM | POA: Diagnosis not present

## 2019-03-04 DIAGNOSIS — I10 Essential (primary) hypertension: Secondary | ICD-10-CM | POA: Diagnosis not present

## 2019-03-04 DIAGNOSIS — R5381 Other malaise: Secondary | ICD-10-CM | POA: Diagnosis not present

## 2019-03-04 DIAGNOSIS — R739 Hyperglycemia, unspecified: Secondary | ICD-10-CM | POA: Diagnosis not present

## 2019-03-04 DIAGNOSIS — E782 Mixed hyperlipidemia: Secondary | ICD-10-CM | POA: Diagnosis not present

## 2019-03-06 DIAGNOSIS — E782 Mixed hyperlipidemia: Secondary | ICD-10-CM | POA: Diagnosis not present

## 2019-03-06 DIAGNOSIS — I10 Essential (primary) hypertension: Secondary | ICD-10-CM | POA: Diagnosis not present

## 2019-03-06 DIAGNOSIS — R739 Hyperglycemia, unspecified: Secondary | ICD-10-CM | POA: Diagnosis not present

## 2019-03-06 DIAGNOSIS — Z6828 Body mass index (BMI) 28.0-28.9, adult: Secondary | ICD-10-CM | POA: Diagnosis not present

## 2019-03-06 DIAGNOSIS — R079 Chest pain, unspecified: Secondary | ICD-10-CM | POA: Diagnosis not present

## 2019-04-02 ENCOUNTER — Other Ambulatory Visit: Payer: Self-pay | Admitting: Cardiovascular Disease

## 2019-04-02 MED FILL — LOSARTAN POTASSIUM 100 MG T: 100 | 90 days supply | Qty: 90 | Fill #0

## 2019-04-02 MED FILL — OMEPRAZOLE DR 20 MG CAPSULE: 20 | 30 days supply | Qty: 30 | Fill #4

## 2019-04-02 MED FILL — CARVEDILOL 6.25 MG TABLET: 6.25 | 90 days supply | Qty: 180 | Fill #0

## 2019-04-02 MED FILL — AMLODIPINE BESYLATE 10 MG T: 10 | 90 days supply | Qty: 90 | Fill #1

## 2019-04-02 MED FILL — SPIRONOLACTONE 25 MG TABS: 25 | 90 days supply | Qty: 90 | Fill #1

## 2019-04-11 ENCOUNTER — Telehealth: Payer: Self-pay | Admitting: *Deleted

## 2019-04-11 NOTE — Telephone Encounter (Signed)
The patient verbally consented for a telehealth phone visit with [facility] and understands that his/her insurance company will be billed for the encounter.  Will try to have vitals and medications.

## 2019-04-19 DIAGNOSIS — R112 Nausea with vomiting, unspecified: Secondary | ICD-10-CM | POA: Diagnosis not present

## 2019-05-02 ENCOUNTER — Encounter: Payer: Self-pay | Admitting: Cardiovascular Disease

## 2019-05-02 ENCOUNTER — Telehealth: Payer: 59 | Admitting: Cardiovascular Disease

## 2019-05-02 ENCOUNTER — Telehealth (INDEPENDENT_AMBULATORY_CARE_PROVIDER_SITE_OTHER): Payer: 59 | Admitting: Cardiovascular Disease

## 2019-05-02 VITALS — BP 141/82 | HR 86 | Ht 75.0 in | Wt 235.0 lb

## 2019-05-02 DIAGNOSIS — I10 Essential (primary) hypertension: Secondary | ICD-10-CM | POA: Diagnosis not present

## 2019-05-02 DIAGNOSIS — Z01812 Encounter for preprocedural laboratory examination: Secondary | ICD-10-CM

## 2019-05-02 DIAGNOSIS — Z7189 Other specified counseling: Secondary | ICD-10-CM

## 2019-05-02 DIAGNOSIS — R079 Chest pain, unspecified: Secondary | ICD-10-CM | POA: Diagnosis not present

## 2019-05-02 DIAGNOSIS — R55 Syncope and collapse: Secondary | ICD-10-CM
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
## 2019-05-02 NOTE — Addendum Note (Signed)
Addended by: Lesle Chris on: 05/02/2019 12:30 PM   Modules accepted: Orders

## 2019-05-02 NOTE — Patient Instructions (Signed)
Medication Instructions:  Continue all current medications.  Labwork:  BMET - order enclosed.   Do 2-3 days prior to CT at [facility] or at [place] across the street.   Testing/Procedures:  Your physician has requested that you have cardiac CT. Cardiac computed tomography (CT) is a painless test that uses an x-ray machine to take clear, detailed pictures of your heart. For further information please visit [email or web address] Please follow instruction sheet as given.  Office will contact with results via phone or letter.    Follow-Up: 3 months   Any Other Special Instructions Will Be Listed Below (If Applicable). You have been referred to:  Dr. Chilton Si - hypertension clinic.  If you need a refill on your cardiac medications before your next appointment, please call your pharmacy.

## 2019-05-05 ENCOUNTER — Telehealth: Payer: Self-pay

## 2019-05-05 NOTE — Telephone Encounter (Signed)
Called patient to get him scheduled for htn clinic left a voice mail for him to call the office back.  Schedule needs to be scheduled for dr. Duke Salvia htn clinic days.

## 2019-05-08 ENCOUNTER — Encounter: Payer: Self-pay | Admitting: *Deleted

## 2019-05-08 ENCOUNTER — Other Ambulatory Visit: Payer: Self-pay | Admitting: *Deleted

## 2019-05-08 MED ORDER — METOPROLOL TARTRATE 100 MG PO TABS: 100.0000 mg | ORAL_TABLET | Freq: Once | ORAL | 0 refills | Status: DC

## 2019-05-13 ENCOUNTER — Telehealth: Payer: Self-pay | Admitting: *Deleted

## 2019-05-13 MED FILL — OMEPRAZOLE DR 20 MG CAPSULE: 20 | 30 days supply | Qty: 30 | Fill #5

## 2019-05-13 NOTE — Telephone Encounter (Signed)
Patient needs to have appointment with Dr Duke Salvia rescheduled in [facility] time slot Left message to call back

## 2019-05-16 NOTE — Telephone Encounter (Signed)
Patient has been moved to 4/28, aware of date and time

## 2019-05-16 NOTE — Telephone Encounter (Signed)
Patient returning call.

## 2019-05-21 ENCOUNTER — Other Ambulatory Visit: Payer: Self-pay

## 2019-05-21 MED ORDER — METOPROLOL TARTRATE 100 MG PO TABS: 100.0000 mg | ORAL_TABLET | Freq: Once | ORAL | 0 refills | Status: DC

## 2019-05-21 MED FILL — METOPROLOL TARTRATE 100 MG: 100 | 1 days supply | Qty: 1 | Fill #0

## 2019-05-21 NOTE — Telephone Encounter (Signed)
Will forward to [place] office.  

## 2019-05-21 NOTE — Telephone Encounter (Signed)
New message   Patient mother called , they need the Lopressor switched from [facility] in [place] to [facility] outpatient pharmacy

## 2019-05-30 DIAGNOSIS — R55 Syncope and collapse: Secondary | ICD-10-CM | POA: Diagnosis not present

## 2019-05-30 DIAGNOSIS — R079 Chest pain, unspecified: Secondary | ICD-10-CM | POA: Diagnosis not present

## 2019-05-30 DIAGNOSIS — I10 Essential (primary) hypertension: Secondary | ICD-10-CM | POA: Diagnosis not present

## 2019-05-30 DIAGNOSIS — Z01812 Encounter for preprocedural laboratory examination: Secondary | ICD-10-CM | POA: Diagnosis not present

## 2019-05-31 LAB — BASIC METABOLIC PANEL
BUN: 13 mg/dL (ref 7–25)
CO2: 28 mmol/L (ref 20–32)
Calcium: 10.4 mg/dL — ABNORMAL HIGH (ref 8.6–10.3)
Chloride: 100 mmol/L (ref 98–110)
Creat: 0.88 mg/dL (ref 0.60–1.35)
Glucose, Bld: 93 mg/dL (ref 65–139)
Potassium: 4.3 mmol/L (ref 3.5–5.3)
Sodium: 139 mmol/L (ref 135–146)

## 2019-06-02 ENCOUNTER — Telehealth (HOSPITAL_COMMUNITY): Payer: Self-pay | Admitting: Emergency Medicine

## 2019-06-02 NOTE — Telephone Encounter (Signed)
Reaching out to patient to offer assistance regarding upcoming cardiac imaging study; pt verbalizes understanding of appt date/time, parking situation and where to check in, pre-test NPO status and medications ordered, and verified current allergies; name and call back number provided for further questions should they arise Makaelah Cranfield RN Navigator Cardiac Imaging [facility] [phone] office [phone] cell 

## 2019-06-03 ENCOUNTER — Ambulatory Visit (HOSPITAL_COMMUNITY)
Admission: RE | Admit: 2019-06-03 | Discharge: 2019-06-03 | Disposition: A | Discharge status: Home / Self Care | Payer: 59 | Source: Ambulatory Visit | Attending: Cardiovascular Disease | Admitting: Cardiovascular Disease

## 2019-06-03 ENCOUNTER — Other Ambulatory Visit: Payer: Self-pay

## 2019-06-03 ENCOUNTER — Encounter (HOSPITAL_COMMUNITY): Payer: Self-pay

## 2019-06-03 DIAGNOSIS — R55 Syncope and collapse: Secondary | ICD-10-CM | POA: Diagnosis not present

## 2019-06-03 DIAGNOSIS — R079 Chest pain, unspecified: Secondary | ICD-10-CM | POA: Diagnosis not present

## 2019-06-03 MED ORDER — NITROGLYCERIN 0.4 MG SL SUBL
SUBLINGUAL_TABLET | SUBLINGUAL | Status: AC
  Filled 2019-06-03: qty 2

## 2019-06-03 MED ORDER — NITROGLYCERIN 0.4 MG SL SUBL
0.8000 mg | SUBLINGUAL_TABLET | Freq: Once | SUBLINGUAL | Status: AC
  Administered 2019-06-03: 0.8 mg via SUBLINGUAL

## 2019-06-03 MED ORDER — METOPROLOL TARTRATE 5 MG/5ML IV SOLN
INTRAVENOUS | Status: AC
  Filled 2019-06-03: qty 5

## 2019-06-03 MED ORDER — IOHEXOL 350 MG/ML SOLN
80.0000 mL | Freq: Once | INTRAVENOUS | Status: AC | PRN
  Administered 2019-06-03: 80 mL via INTRAVENOUS

## 2019-06-04 ENCOUNTER — Ambulatory Visit (INDEPENDENT_AMBULATORY_CARE_PROVIDER_SITE_OTHER): Payer: 59 | Admitting: Cardiovascular Disease

## 2019-06-04 ENCOUNTER — Encounter: Payer: Self-pay | Admitting: Cardiovascular Disease

## 2019-06-04 DIAGNOSIS — K219 Gastro-esophageal reflux disease without esophagitis: Secondary | ICD-10-CM

## 2019-06-04 DIAGNOSIS — I10 Essential (primary) hypertension: Secondary | ICD-10-CM | POA: Diagnosis not present

## 2019-06-04 DIAGNOSIS — R0789 Other chest pain: Secondary | ICD-10-CM

## 2019-06-04 MED ORDER — VALSARTAN 320 MG PO TABS: 320.0000 mg | ORAL_TABLET | Freq: Every day | ORAL | 1 refills | Status: DC

## 2019-06-04 MED FILL — VALSARTAN 320 MG TAB: 320 | 90 days supply | Qty: 90 | Fill #0

## 2019-06-04 NOTE — Patient Instructions (Addendum)
Medication Instructions:  STOP LOSARTAN   START VALSARTAN 320 MG DAILY   *If you need a refill on your cardiac medications before your next appointment, please call your pharmacy*  Lab Work: NONE   Testing/Procedures: NONE  Follow-Up: Your physician recommends that you schedule a follow-up appointment in: PHARM D FOR 1 MONTH  Other Instructions MONITOR YOUR BLOOD PRESSURE TWICE A DAY, LOG IN BOOK PROVIDED  BRING BOOK AND BLOOD PRESSURE MACHINE TO FOLLOW UP   WILL HAVE AMY CARE GUIDE TO REACH OUT TO YOU REGARDING SMOKING

## 2019-06-04 NOTE — Progress Notes (Signed)
Hypertension Clinic Initial Assessment:    Date:  06/21/2019   ID:  Benjamin Chang, DOB January 26, 1996, MRN [id number]  PCP:  Selinda Flavin, MD  Cardiologist:  Prentice Docker, MD  Nephrologist:  Referring MD: Laqueta Linden, MD   CC: Hypertension  History of Present Illness:    Benjamin Chang is a 24 y.o. male with a hx of resistant hypertension here to establish care in the hypertension clinic.  He was first diagnosed with hypertensin at age 45.  Since then his BP has never been fully controlled.  Lately his BP has been around 135/80 at home.  He checks it once per month.  He first saw Dr. Purvis Sheffield in 2018 for chest pain after presenting to [facility].  His work up was unremarkable and he was started on omeprazole.  Echo 09/2014 showed LVEF 60-65% and was otherwise unremarkable.  He still has episodes of severe chest pain when lying in bed about to fall asleep.  It lasts for 30 minuts to an hour.  The episodes are associated with shortness of breath and diaphoresis but no nausea.  He has GERD but thinks it is well-controlled.  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  He denies exertional discomfort other than occasionally with lifting.    Benjamin Chang had renal artery Dopplers that were negative 01/2018.  He was evaluated for hyperaldosteronism and pheochromocytoma.  He continues to struggle with his BP.  He does not have an extensive family history of early onset hypertension.  He does have a history of syncope but denies hypotension.   At the time of his syncopal event, his blood pressure was around 140/80.  He notes pain in his calves and mild ankle edema.  He has no orthopnea or PND.  He snores but denies apneic episodes.  He feels rested in the AM and has no daytime somnolence.  He cooks most meals at home and limits sodium intake.  He doesn't use any over the counter medications or supplements.  He drinks EtOH occasionally and has two caffeinated drinks daily.  He smokes  1/2 ppd and is trying to cut back to 4-5/day.  He doesn't want help with quitting at this time.    Previous antihypertensives: Clonidine- groggy lisinopril face swelling Losartan/HCTZ Amlodipine Carvedilol, spironolactone  Past Medical History:  Diagnosis Date  . Atypical chest pain 06/21/2019  . Essential hypertension 06/21/2019  . GERD (gastroesophageal reflux disease) 06/21/2019  . Hypertension     Past Surgical History:  Procedure Laterality Date  . LAPAROSCOPIC APPENDECTOMY N/A 11/04/2015   Procedure: APPENDECTOMY LAPAROSCOPIC;  Surgeon: Ancil Linsey, MD;  Location: [facility];  Service: General;  Laterality: N/A;  . TONSILLECTOMY      Current Medications: Current Meds  Medication Sig  . amLODipine (NORVASC) 10 MG tablet TAKE 1 TABLET (10 MG TOTAL) BY MOUTH DAILY.  . carvedilol (COREG) 6.25 MG tablet TAKE 1 TABLET BY MOUTH 2 TIMES DAILY.  . cetirizine (ZYRTEC) 10 MG tablet Take 10 mg by mouth daily.  . cloNIDine (CATAPRES) 0.1 MG tablet Take 1 tablet (0.1 mg total) by mouth at bedtime.  Marland Kitchen omeprazole (PRILOSEC) 20 MG capsule Take 20 mg by mouth daily.  Marland Kitchen spironolactone (ALDACTONE) 25 MG tablet TAKE 1 TABLET BY MOUTH DAILY.  . [DISCONTINUED] hydrochlorothiazide (HYDRODIURIL) 25 MG tablet TAKE 1 TABLET (25 MG TOTAL) BY MOUTH DAILY.  . [DISCONTINUED] losartan (COZAAR) 100 MG tablet TAKE 1 TABLET BY MOUTH ONCE DAILY  Allergies:   Lisinopril   Social History   Socioeconomic History  . Marital status: Single    Spouse name: Not on file  . Number of children: Not on file  . Years of education: Not on file  . Highest education level: Not on file  Occupational History  . Occupation: Holiday representative  Tobacco Use  . Smoking status: Current Some Day Smoker    Packs/day: 0.50    Types: Cigarettes  . Smokeless tobacco: Never Used  Substance and Sexual Activity  . Alcohol use: No  . Drug use: No  . Sexual activity: Not on file  Other Topics Concern  . Not on file  Social  History Narrative   ** Merged History Encounter **       Social Determinants of Health   Financial Resource Strain:   . Difficulty of Paying Living Expenses:   Food Insecurity:   . Worried About Programme researcher, broadcasting/film/video in the Last Year:   . Barista in the Last Year:   Transportation Needs:   . Freight forwarder (Medical):   Marland Kitchen Lack of Transportation (Non-Medical):   Physical Activity:   . Days of Exercise per Week:   . Minutes of Exercise per Session:   Stress:   . Feeling of Stress :   Social Connections:   . Frequency of Communication with Friends and Family:   . Frequency of Social Gatherings with Friends and Family:   . Attends Religious Services:   . Active Member of Clubs or Organizations:   . Attends Banker Meetings:   Marland Kitchen Marital Status:      Family History: The patient's family history includes Aneurysm in his maternal grandfather; Dementia in his paternal grandfather; Diabetes in his paternal grandfather; Heart attack in his paternal grandfather; Heart disease in his father, maternal grandmother, mother, and another family member; Hypertension in his father, maternal grandmother, mother, paternal grandfather, and paternal grandmother.  ROS:   Please see the history of present illness.    All other systems reviewed and are negative.  EKGs/Labs/Other Studies Reviewed:    EKG:  EKG is not ordered today.  The ekg ordered 11/16/17 demonstrates sinus rhythm.  Rate 75 bpm.  Recent Labs: 05/30/2019: BUN 13; Creat 0.88; Potassium 4.3; Sodium 139   Recent Lipid Panel No results found for: CHOL, TRIG, HDL, CHOLHDL, VLDL, LDLCALC, LDLDIRECT  Physical Exam:    VS:  BP (!) 150/82   Pulse 99   Ht 6\' 3"  (1.905 m)   Wt 237 lb (107.5 kg)   SpO2 95%   BMI 29.62 kg/m     Wt Readings from Last 3 Encounters:  06/04/19 237 lb (107.5 kg)  05/02/19 235 lb (106.6 kg)  04/12/18 231 lb 3.2 oz (104.9 kg)    VS:  BP (!) 150/82   Pulse 99   Ht 6\' 3"  (1.905  m)   Wt 237 lb (107.5 kg)   SpO2 95%   BMI 29.62 kg/m  , BMI Body mass index is 29.62 kg/m. GENERAL:  Well appearing HEENT: Pupils equal round and reactive, fundi not visualized, oral mucosa unremarkable NECK:  No jugular venous distention, waveform within normal limits, carotid upstroke brisk and symmetric, no bruits LUNGS:  Clear to auscultation bilaterally HEART:  RRR.  PMI not displaced or sustained,S1 and S2 within normal limits, no S3, no S4, no clicks, no rubs, no murmurs ABD:  Flat, positive bowel sounds normal in frequency in pitch, no bruits, no  rebound, no guarding, no midline pulsatile mass, no hepatomegaly, no splenomegaly EXT:  2 plus pulses throughout, no edema, no cyanosis no clubbing SKIN:  No rashes no nodules NEURO:  Cranial nerves II through XII grossly intact, motor grossly intact throughout PSYCH:  Cognitively intact, oriented to person place and time   ASSESSMENT:    1. Essential hypertension   2. Gastroesophageal reflux disease, unspecified whether esophagitis present   3. Atypical chest pain     PLAN:    # Essential hypertension:  Benjamin Chang' BP was elevated both initially and on repeat.  It seems that it is a bit better controlled at home.  He had the appropriate work up for secondary causes, including renal artery Dopplers and screening for pheochromocytoma and hyperaldosteronism.  He limits sodium intake.  We discussed limiting caffeine.  It is also important that he quit smoking.  Thyroid was normal 02/2019.  He doesn't use OTC supplements and medications. It doesn't seem that there is a clear secondary cause, despite hi young age.  Recommend that he check his BP twice daily and bring to follow up.  He is not interested in the PREP program.  We will switch losartan to valsartan 320mg  daily.  # Tobacco abuse:  We will have our Care Guide contact him to assist with smoking cessation.   Disposition:    FU with MD/PharmD in 1 month    Medication  Adjustments/Labs and Tests Ordered: Current medicines are reviewed at length with the patient today.  Concerns regarding medicines are outlined above.  No orders of the defined types were placed in this encounter.  Meds ordered this encounter  Medications  . valsartan (DIOVAN) 320 MG tablet    Sig: Take 1 tablet (320 mg total) by mouth daily.    Dispense:  90 tablet    Refill:  1    D/C LOSARTAN     Signed, Skeet Latch, MD  06/21/2019 6:28 PM    [place]

## 2019-06-05 ENCOUNTER — Telehealth: Payer: Self-pay

## 2019-06-05 NOTE — Telephone Encounter (Signed)
Benjamin Chang, Care Guide/Health Coach called patient to schedule an initial appointment to address concerns discussed with Dr. Duke Salvia during visit on 06/04/19. Care guide can be reached at [phone] or [phone].

## 2019-06-06 ENCOUNTER — Ambulatory Visit: Payer: 59 | Admitting: Cardiovascular Disease

## 2019-06-11 ENCOUNTER — Telehealth: Payer: Self-pay

## 2019-06-11 NOTE — Telephone Encounter (Signed)
Pt returned call about health coaching regarding smoking cessation. Pt expressed they were not interested at this time because they are already implementing strategies to quit smoking on their own (e.g., chewing gum, etc.) and has reduced smoking already. Pt will call back if they need additional help.

## 2019-06-19 ENCOUNTER — Other Ambulatory Visit: Payer: Self-pay | Admitting: Cardiovascular Disease

## 2019-06-19 ENCOUNTER — Other Ambulatory Visit (HOSPITAL_COMMUNITY): Payer: Self-pay | Admitting: Family Medicine

## 2019-06-21 ENCOUNTER — Encounter: Payer: Self-pay | Admitting: Cardiovascular Disease

## 2019-06-21 DIAGNOSIS — I10 Essential (primary) hypertension: Secondary | ICD-10-CM

## 2019-06-21 DIAGNOSIS — R0789 Other chest pain: Secondary | ICD-10-CM

## 2019-06-21 DIAGNOSIS — K219 Gastro-esophageal reflux disease without esophagitis: Secondary | ICD-10-CM | POA: Insufficient documentation

## 2019-06-21 HISTORY — DX: Other chest pain: R07.89

## 2019-06-21 HISTORY — DX: Gastro-esophageal reflux disease without esophagitis: K21.9

## 2019-06-21 HISTORY — DX: Essential (primary) hypertension: I10

## 2019-07-11 ENCOUNTER — Ambulatory Visit: Payer: 59

## 2019-07-11 NOTE — Progress Notes (Deleted)
     07/11/2019 Benjamin Chang 21-Mar-1995 [id number]   HPI:  Benjamin Chang is a 24 y.o. male patient of Dr Duke Salvia, who presents today for hypertension clinic evaluation.  In addition to hypertension, his medical history is significant for tobacco abuse, GERD and angina.  He was first diagnosed with hypertension at age 29 and has not yet been fully controlled.  When he saw Dr. Duke Salvia his pressure was 150/82.  She switched his losartan 100 mg to valsartan 320 mg.  He returns today for follow up.    Secondary Workup: Renal dopplers 01/2018 - negative  aldosteronism 10/2016 - negative  pheochromocytoma 10/2016 - negative  thyroid 02/2019:  TSH 2.95  Sleep apnea Snores but denies apneic episodes  OTC/herbal negative        Blood Pressure Goal:  130/80  Current Medications:  Family Hx:  Social Hx: 1/2 ppd smoking (trying to cut back but not interested in help at this time), occasional alcohol, 2 caffeinated drinks per day  Diet: cooks meals at home, limits salt intake  Exercise:  Home BP readings:  Intolerances:   Labs:  Wt Readings from Last 3 Encounters:  06/04/19 237 lb (107.5 kg)  05/02/19 235 lb (106.6 kg)  04/12/18 231 lb 3.2 oz (104.9 kg)   BP Readings from Last 3 Encounters:  06/04/19 (!) 150/82  06/03/19 118/74  05/02/19 (!) 141/82   Pulse Readings from Last 3 Encounters:  06/04/19 99  05/02/19 86  04/12/18 72    Current Outpatient Medications  Medication Sig Dispense Refill  . amLODipine (NORVASC) 10 MG tablet TAKE 1 TABLET (10 MG TOTAL) BY MOUTH DAILY. 90 tablet 1  . carvedilol (COREG) 6.25 MG tablet TAKE 1 TABLET BY MOUTH 2 TIMES DAILY. 180 tablet 0  . cetirizine (ZYRTEC) 10 MG tablet Take 10 mg by mouth daily.    . cloNIDine (CATAPRES) 0.1 MG tablet Take 1 tablet (0.1 mg total) by mouth at bedtime. 90 tablet 1  . hydrochlorothiazide (HYDRODIURIL) 25 MG tablet TAKE 1 TABLET (25 MG TOTAL) BY MOUTH DAILY. 90 tablet 1  . omeprazole (PRILOSEC) 20 MG  capsule Take 20 mg by mouth daily.    Marland Kitchen spironolactone (ALDACTONE) 25 MG tablet TAKE 1 TABLET BY MOUTH DAILY. 90 tablet 3  . valsartan (DIOVAN) 320 MG tablet Take 1 tablet (320 mg total) by mouth daily. 90 tablet 1   No current facility-administered medications for this visit.    Allergies  Allergen Reactions  . Lisinopril     Lips swell    Past Medical History:  Diagnosis Date  . Atypical chest pain 06/21/2019  . Essential hypertension 06/21/2019  . GERD (gastroesophageal reflux disease) 06/21/2019  . Hypertension     There were no vitals taken for this visit.  No problem-specific Assessment & Plan notes found for this encounter.   Phillips Hay PharmD CPP [facility] [address] [place], [place] [address] [phone]

## 2019-08-04 ENCOUNTER — Other Ambulatory Visit: Payer: Self-pay | Admitting: Cardiovascular Disease

## 2019-08-04 MED FILL — AMLODIPINE BESYLATE 10 MG T: 10 | 90 days supply | Qty: 90 | Fill #0

## 2019-08-04 MED FILL — OMEPRAZOLE DR 20 MG CAPSULE: 20 | 30 days supply | Qty: 30 | Fill #1

## 2019-08-05 ENCOUNTER — Telehealth: Payer: 59 | Admitting: Cardiovascular Disease

## 2019-08-07 NOTE — Progress Notes (Deleted)
Cardiology Office Note  Date: 08/07/2019   ID: FARES RAMTHUN, DOB 21-Nov-1995, MRN [id number]  PCP:  Selinda Flavin, MD  Cardiologist:  Prentice Docker, MD Electrophysiologist:  None   Chief Complaint: F/U HTN  History of Present Illness: Benjamin Chang is a 24 y.o. male with a history of resistant hypertension.  He was referred to hypertension clinic by Dr. Purvis Sheffield.  Last seen by Dr. Duke Salvia 06/04/2019.  First saw Dr. Purvis Sheffield in 2018 for chest pain after presenting to [facility].  His work-up was unremarkable and was started on omeprazole.  Echo 2016 showed EF of 60-65%, otherwise unremarkable.  Still having episodes of severe chest pain when lying in bed about to fall asleep.  Lasting for about 30 minutes to an hour.  Episodes associated with shortness of breath and diaphoresis but no nausea.  Has GERD but thinks it is well controlled.  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  He denied any exertional discomfort other than occasionally with lifting.  He had previous work-up for hyperaldosteronism and pheochromocytoma.  Extensive family history of early onset hypertension.  History of syncope but denied hypotension.  No orthopnea or PND. Smokes 1/2 pack/day, trying to cut back to 4 to 5/day. Not interested in quitting.  Dr. Duke Salvia advised him to limit caffeine intake, quit smoking, recommended he check his blood pressure twice daily and bring to follow-up.  He was switched from losartan to valsartan 320 mg daily.  She stated she would have her Care Guide to contact him to assist with smoking cessation.   Past Medical History:  Diagnosis Date  . Atypical chest pain 06/21/2019  . Essential hypertension 06/21/2019  . GERD (gastroesophageal reflux disease) 06/21/2019  . Hypertension     Past Surgical History:  Procedure Laterality Date  . LAPAROSCOPIC APPENDECTOMY N/A 11/04/2015   Procedure: APPENDECTOMY LAPAROSCOPIC;  Surgeon: Ancil Linsey, MD;  Location: [facility];   Service: General;  Laterality: N/A;  . TONSILLECTOMY      Current Outpatient Medications  Medication Sig Dispense Refill  . amLODipine (NORVASC) 10 MG tablet TAKE 1 TABLET (10 MG TOTAL) BY MOUTH DAILY. 90 tablet 2  . carvedilol (COREG) 6.25 MG tablet TAKE 1 TABLET BY MOUTH 2 TIMES DAILY. 180 tablet 0  . cetirizine (ZYRTEC) 10 MG tablet Take 10 mg by mouth daily.    . cloNIDine (CATAPRES) 0.1 MG tablet Take 1 tablet (0.1 mg total) by mouth at bedtime. 90 tablet 1  . hydrochlorothiazide (HYDRODIURIL) 25 MG tablet TAKE 1 TABLET (25 MG TOTAL) BY MOUTH DAILY. 90 tablet 1  . omeprazole (PRILOSEC) 20 MG capsule Take 20 mg by mouth daily.    Marland Kitchen spironolactone (ALDACTONE) 25 MG tablet TAKE 1 TABLET BY MOUTH DAILY. 90 tablet 3  . valsartan (DIOVAN) 320 MG tablet Take 1 tablet (320 mg total) by mouth daily. 90 tablet 1   No current facility-administered medications for this visit.   Allergies:  Lisinopril   Social History: The patient  reports that he has been smoking cigarettes. He has been smoking about 0.50 packs per day. He has never used smokeless tobacco. He reports that he does not drink alcohol and does not use drugs.   Family History: The patient's family history includes Aneurysm in his maternal grandfather; Dementia in his paternal grandfather; Diabetes in his paternal grandfather; Heart attack in his paternal grandfather; Heart disease in his father, maternal grandmother, mother, and another family member; Hypertension in his father, maternal grandmother,  mother, paternal grandfather, and paternal grandmother.   ROS:  Please see the history of present illness. Otherwise, complete review of systems is positive for {NONE DEFAULTED:18576::"none"}.  All other systems are reviewed and negative.   Physical Exam: VS:  There were no vitals taken for this visit., BMI There is no height or weight on file to calculate BMI.  Wt Readings from Last 3 Encounters:  06/04/19 237 lb (107.5 kg)  05/02/19  235 lb (106.6 kg)  04/12/18 231 lb 3.2 oz (104.9 kg)    General: Patient appears comfortable at rest. HEENT: Conjunctiva and lids normal, oropharynx clear with moist mucosa. Neck: Supple, no elevated JVP or carotid bruits, no thyromegaly. Lungs: Clear to auscultation, nonlabored breathing at rest. Cardiac: Regular rate and rhythm, no S3 or significant systolic murmur, no pericardial rub. Abdomen: Soft, nontender, no hepatomegaly, bowel sounds present, no guarding or rebound. Extremities: No pitting edema, distal pulses 2+. Skin: Warm and dry. Musculoskeletal: No kyphosis. Neuropsychiatric: Alert and oriented x3, affect grossly appropriate.  ECG:  {EKG/Telemetry Strips Reviewed:[phone]}  Recent Labwork: 05/30/2019: BUN 13; Creat 0.88; Potassium 4.3; Sodium 139  No results found for: CHOL, TRIG, HDL, CHOLHDL, VLDL, LDLCALC, LDLDIRECT  Other Studies Reviewed Today:  Coronary CT 05/14/2019 IMPRESSION: 1.Coronary artery calcium score 0 Agatston units, suggesting low risk for future cardiac events.  2.  No significant coronary disease noted.    Renal artery duplex study 03/10/2017 Renal:    Right: Normal size right kidney. No evidence of right renal artery     stenosis. RRV flow present. Normal right Resisitive Index.     Normal cortical thickness of right kidney.  Left: Normal size of left kidney. Normal left Resistive Index.     Normal cortical thickness of the left kidney. No evidence of     left renal artery stenosis. LRV flow present.  Mesenteric:  Normal Celiac artery and Superior Mesenteric artery findings.    10/31/2016 Echo Study Conclusions   - Left ventricle: The cavity size was normal. Wall thickness was  increased increased in a pattern of mild to moderate LVH.  Systolic function was normal. The estimated ejection fraction was  in the range of 60% to 65%. Wall motion was normal; there were no  regional wall motion abnormalities. Left  ventricular diastolic  function parameters were normal.  - Aortic valve: Valve area (VTI): 2.7 cm^2. Valve area (Vmax): 3.26  cm^2.  - Technically adequate study  Assessment and Plan:  1. Essential hypertension   2. Smoking      Medication Adjustments/Labs and Tests Ordered: Current medicines are reviewed at length with the patient today.  Concerns regarding medicines are outlined above.   Disposition: Follow-up with ***  Signed, Rennis Harding, NP 08/07/2019 10:03 PM    [facility] at [facility] [address] [place], [place], [place] [address] Phone: [phone]; Fax: [phone]

## 2019-08-08 ENCOUNTER — Ambulatory Visit: Payer: 59 | Admitting: Family Medicine

## 2019-08-08 ENCOUNTER — Encounter: Payer: Self-pay | Admitting: Family Medicine

## 2019-08-21 ENCOUNTER — Other Ambulatory Visit: Payer: Self-pay

## 2019-08-21 ENCOUNTER — Ambulatory Visit (INDEPENDENT_AMBULATORY_CARE_PROVIDER_SITE_OTHER): Payer: 59 | Admitting: Pharmacist

## 2019-08-21 VITALS — BP 138/80 | Ht 75.0 in | Wt 241.0 lb

## 2019-08-21 DIAGNOSIS — I10 Essential (primary) hypertension: Secondary | ICD-10-CM | POA: Diagnosis not present

## 2019-08-21 MED ORDER — CHLORTHALIDONE 25 MG PO TABS
12.5000 mg | ORAL_TABLET | Freq: Every day | ORAL | 1 refills | Status: DC
Start: 2019-08-21 — End: 2019-10-29

## 2019-08-21 MED ORDER — SPIRONOLACTONE 25 MG PO TABS: 25.0000 mg | ORAL_TABLET | Freq: Every day | ORAL | 3 refills | Status: DC

## 2019-08-21 MED FILL — SPIRONOLACTONE 25 MG TABS: 25 | 90 days supply | Qty: 90 | Fill #0

## 2019-08-21 MED FILL — CHLORTHALIDONE 25 MG TABS: 25 | 30 days supply | Qty: 15 | Fill #0

## 2019-08-21 NOTE — Patient Instructions (Signed)
Return for a follow up appointment in 4 weeks  Go to the lab in 2-3 days prior to next follow up  Check your blood pressure at home daily (if able) and keep record of the readings.  Take your BP meds as follows: *STOP taking HCTZ *START taking chlorthalidone 12.5mg  every day*  Bring all of your meds, your BP cuff and your record of home blood pressures to your next appointment.  Exercise as you're able, try to walk approximately 30 minutes per day.  Keep salt intake to a minimum, especially watch canned and prepared boxed foods.  Eat more fresh fruits and vegetables and fewer canned items.  Avoid eating in fast food restaurants.    HOW TO TAKE YOUR BLOOD PRESSURE: . Rest 5 minutes before taking your blood pressure. .  Don't smoke or drink caffeinated beverages for at least 30 minutes before. . Take your blood pressure before (not after) you eat. . Sit comfortably with your back supported and both feet on the floor (don't cross your legs). . Elevate your arm to heart level on a table or a desk. . Use the proper sized cuff. It should fit smoothly and snugly around your bare upper arm. There should be enough room to slip a fingertip under the cuff. The bottom edge of the cuff should be 1 inch above the crease of the elbow. . Ideally, take 3 measurements at one sitting and record the average.

## 2019-08-21 NOTE — Assessment & Plan Note (Signed)
Blood pressure better contolled  Today, but patient reports some leg cramps and pain. Noted compliance with all current medication and low sodium diet. Patient decreased daily caffeinated beverages from 2 to 1 per day and continues to work on decreasing amount of cigarettes per day.   Will change HCTZ to chlorthalidone 12.5mg  daily, repeat BMET in 3 weeks and follow up at HTN clinic in 4-5 weeks. Plan to increase carvedilol to 12.5mg  BID during next OV if additional BP control is needed.

## 2019-08-21 NOTE — Progress Notes (Signed)
Patient ID: Benjamin Chang                 DOB: 13-Apr-1995                      MRN: [id number]     HPI: Benjamin Chang is a 24 y.o. male referred by Dr. Duke Salvia to HTN clinic. PMH includes resistance hypertension (dx with HTN at age 75), hx of syncope, GERD, and atypical chest pain. He had the appropriate work up for secondary causes, including renal artery Dopplers and screening for pheochromocytoma and hyperaldosteronism. Thyroid was normal 02/2019. Losartan was chnaged to valsartan by DR Duke Salvia on 06/04/2019. Patient instructed to follow up with PharmD in 1 month, but patient didn't show for his appointment. Also refused Care Guidance assitant for smoking cessation.   Current HTN meds:  Amlodipine 10mg  daily Carvedilol 6.25mg  twice daily HCTZ 25mg  daily  Spironolactone 25mg  daily Valsartan 320mg  daily   Previously tried:  Lisinopril - face swelling Clonidine - groggy Losartan/HCT  BP goal: <130/80  Family History: The patient's family history includes Aneurysm in his maternal grandfather; Dementia in his paternal grandfather; Diabetes in his paternal grandfather; Heart attack in his paternal grandfather; Heart disease in his father, maternal grandmother, mother, and another family member; Hypertension in his father, maternal grandmother, mother, paternal grandfather, and paternal grandmother.  Social History: occasional alcohol, current smoker (1/2 ppd of cigarettes), down 2 caffeinated drinks per day  Diet: most mels at home and limits sodium intake,   Exercise:walks lots at work and physical work (coinryuctions)  Home BP readings:    Wt Readings from Last 3 Encounters:  08/21/19 241 lb (109.3 kg)  06/04/19 237 lb (107.5 kg)  05/02/19 235 lb (106.6 kg)   BP Readings from Last 3 Encounters:  08/21/19 138/80  06/04/19 (!) 150/82  06/03/19 118/74   Pulse Readings from Last 3 Encounters:  06/04/19 99  05/02/19 86  04/12/18 72     Past Medical History:  Diagnosis  Date   Atypical chest pain 06/21/2019   Essential hypertension 06/21/2019   GERD (gastroesophageal reflux disease) 06/21/2019   Hypertension     Current Outpatient Medications on File Prior to Visit  Medication Sig Dispense Refill   amLODipine (NORVASC) 10 MG tablet TAKE 1 TABLET (10 MG TOTAL) BY MOUTH DAILY. 90 tablet 2   carvedilol (COREG) 6.25 MG tablet TAKE 1 TABLET BY MOUTH 2 TIMES DAILY. 180 tablet 0   cetirizine (ZYRTEC) 10 MG tablet Take 10 mg by mouth daily.     hydrochlorothiazide (HYDRODIURIL) 25 MG tablet TAKE 1 TABLET (25 MG TOTAL) BY MOUTH DAILY. 90 tablet 1   Multiple Vitamins-Minerals (MULTIVITAMIN WITH MINERALS) tablet Take 1 tablet by mouth daily.     Omega-3 Fatty Acids (OMEGA-3 2100 PO) Take by mouth.     omeprazole (PRILOSEC) 20 MG capsule Take 20 mg by mouth daily.     valsartan (DIOVAN) 320 MG tablet Take 1 tablet (320 mg total) by mouth daily. 90 tablet 1   No current facility-administered medications on file prior to visit.    Allergies  Allergen Reactions   Lisinopril     Lips swell    Blood pressure 138/80, height 6\' 3"  (1.905 m), weight 241 lb (109.3 kg).  No problem-specific Assessment & Plan notes found for this encounter.    Niccolo Burggraf Rodriguez-Guzman PharmD, BCPS, CPP [facility] [address] [place] 06/23/2019 08/21/2019 9:24 AM

## 2019-09-04 MED FILL — OMEPRAZOLE DR 20 MG CAPSULE: 20 | 30 days supply | Qty: 30 | Fill #2

## 2019-09-19 MED FILL — VALSARTAN 320 MG TAB: 320 | 90 days supply | Qty: 90 | Fill #1

## 2019-09-22 MED FILL — CHLORTHALIDONE 25 MG TABS: 25 | 30 days supply | Qty: 15 | Fill #1

## 2019-09-24 ENCOUNTER — Ambulatory Visit: Payer: 59 | Admitting: Cardiovascular Disease

## 2019-10-01 ENCOUNTER — Other Ambulatory Visit (HOSPITAL_COMMUNITY)
Admission: RE | Admit: 2019-10-01 | Discharge: 2019-10-01 | Disposition: A | Discharge status: Home / Self Care | Payer: 59 | Source: Ambulatory Visit | Attending: Cardiovascular Disease | Admitting: Cardiovascular Disease

## 2019-10-01 DIAGNOSIS — I10 Essential (primary) hypertension: Secondary | ICD-10-CM | POA: Insufficient documentation

## 2019-10-01 LAB — BASIC METABOLIC PANEL
Anion gap: 10 (ref 5–15)
BUN: 18 mg/dL (ref 6–20)
CO2: 26 mmol/L (ref 22–32)
Calcium: 9.3 mg/dL (ref 8.9–10.3)
Chloride: 100 mmol/L (ref 98–111)
Creatinine, Ser: 0.79 mg/dL (ref 0.61–1.24)
GFR calc Af Amer: >60 mL/min (ref >60)
GFR calc non Af Amer: >60 mL/min (ref >60)
Glucose, Bld: 108 mg/dL — ABNORMAL HIGH (ref 70–99)
Potassium: 3.3 mmol/L — ABNORMAL LOW (ref 3.5–5.1)
Sodium: 136 mmol/L (ref 135–145)

## 2019-10-01 NOTE — Progress Notes (Signed)
Cardiology Office Note    Date:  10/02/2019   ID:  Benjamin Chang, DOB 02/15/1995, MRN [id number]  PCP:  Selinda Flavin, MD  Cardiologist: Prentice Docker, MD (Inactive)   HTN Clinic: Dr. Duke Salvia   Chief Complaint  Patient presents with  . Follow-up    6 month visit    History of Present Illness:    Benjamin Chang is a 24 y.o. male with past medical history of resistant hypertension, GERD, chest pain (Coronary CT in 05/2019 showing calcium score of 0) and tobacco use who presents to the office today for 44-month follow-up.  He was last examined by Dr. Purvis Sheffield in 04/2019 and BP had overall been well controlled when checked at home. He was not taking Clonidine as he felt groggy and fatigued with the medication.  BP was at 141/82 the day of his telehealth visit and he was continued on Amlodipine 10 mg daily, Losartan 100 mg daily, HCTZ 25 mg daily, Coreg 6.25 mg twice daily and Spironolactone 25 mg daily. He did report recent episodes of chest pain and a Coronary CT was recommended for further evaluation. He was referred to the advanced hypertension clinic given his multiple medication intolerances. He did undergo a Coronary CT in 05/2019 which showed a coronary calcium score of 0 and no acute or significant extracardiac abnormalities.  He did follow-up with Dr. Duke Salvia in 05/2019 and it was recommended to switch from Losartan to Valsartan 320 mg daily. He was scheduled to follow-up with pharmacy 1 month later but did not keep the appointment. He was evaluated by pharmacy in 08/2019 and BP was at 130/80.  It was recommended to change HCTZ to Chlorthalidone 12.5 mg daily and obtain a repeat BMET in 3 weeks. If BP remained elevated, was recommended to increase Coreg to 12.5 mg twice daily.  In talking with the patient today, he reports his blood pressure readings have been variable when checked at home as SBP can range from the 130's to 150's. BP is at 116/66 during today's visit and  personally rechecked with similar results of 122/68. He reports having headaches when his blood pressure is significantly elevated but denies any recent symptoms. He does describe episodes of chest pain which were occurring in the past and typically worse at night and would improve with sitting up. He does take Prilosec 20 mg daily. No recent exertional chest pain.  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 He does try to stay hydrated and consumes over 6 bottles of water daily. He has noticed dizziness when changing positions.   Past Medical History:  Diagnosis Date  . Atypical chest pain 06/21/2019  . Essential hypertension 06/21/2019  . GERD (gastroesophageal reflux disease) 06/21/2019  . Hypertension     Past Surgical History:  Procedure Laterality Date  . LAPAROSCOPIC APPENDECTOMY N/A 11/04/2015   Procedure: APPENDECTOMY LAPAROSCOPIC;  Surgeon: Ancil Linsey, MD;  Location: [facility];  Service: General;  Laterality: N/A;  . TONSILLECTOMY      Current Medications: Outpatient Medications Prior to Visit  Medication Sig Dispense Refill  . amLODipine (NORVASC) 10 MG tablet TAKE 1 TABLET (10 MG TOTAL) BY MOUTH DAILY. 90 tablet 2  . carvedilol (COREG) 6.25 MG tablet TAKE 1 TABLET BY MOUTH 2 TIMES DAILY. 180 tablet 0  . cetirizine (ZYRTEC) 10 MG tablet Take 10 mg by mouth daily.    . chlorthalidone (HYGROTON) 25 MG tablet Take 0.5 tablets (12.5 mg total) by  mouth daily. 15 tablet 1  . Multiple Vitamins-Minerals (MULTIVITAMIN WITH MINERALS) tablet Take 1 tablet by mouth daily.    . Omega-3 Fatty Acids (OMEGA-3 2100 PO) Take by mouth.    Marland Kitchen omeprazole (PRILOSEC) 20 MG capsule Take 20 mg by mouth daily.    Marland Kitchen spironolactone (ALDACTONE) 25 MG tablet Take 1 tablet (25 mg total) by mouth daily. 90 tablet 3  . valsartan (DIOVAN) 320 MG tablet Take 1 tablet (320 mg total) by mouth daily. 90 tablet 1   No facility-administered medications prior to visit.      Allergies:   Lisinopril   Social History   Socioeconomic History  . Marital status: Single    Spouse name: Not on file  . Number of children: Not on file  . Years of education: Not on file  . Highest education level: Not on file  Occupational History  . Occupation: Holiday representative  Tobacco Use  . Smoking status: Current Some Day Smoker    Packs/day: 0.50    Types: Cigarettes  . Smokeless tobacco: Never Used  Vaping Use  . Vaping Use: Never used  Substance and Sexual Activity  . Alcohol use: No  . Drug use: No  . Sexual activity: Not on file  Other Topics Concern  . Not on file  Social History Narrative   ** Merged History Encounter **       Social Determinants of Health   Financial Resource Strain:   . Difficulty of Paying Living Expenses: Not on file  Food Insecurity:   . Worried About Programme researcher, broadcasting/film/video in the Last Year: Not on file  . Ran Out of Food in the Last Year: Not on file  Transportation Needs:   . Lack of Transportation (Medical): Not on file  . Lack of Transportation (Non-Medical): Not on file  Physical Activity:   . Days of Exercise per Week: Not on file  . Minutes of Exercise per Session: Not on file  Stress:   . Feeling of Stress : Not on file  Social Connections:   . Frequency of Communication with Friends and Family: Not on file  . Frequency of Social Gatherings with Friends and Family: Not on file  . Attends Religious Services: Not on file  . Active Member of Clubs or Organizations: Not on file  . Attends Banker Meetings: Not on file  . Marital Status: Not on file     Family History:  The patient's family history includes Aneurysm in his maternal grandfather; Dementia in his paternal grandfather; Diabetes in his paternal grandfather; Heart attack in his paternal grandfather; Heart disease in his father, maternal grandmother, mother, and another family member; Hypertension in his father, maternal grandmother, mother, paternal  grandfather, and paternal grandmother.   Review of Systems:   Please see the history of present illness.     General:  No chills, fever, night sweats or weight changes.  Cardiovascular:  No dyspnea on exertion, edema, orthopnea, palpitations, paroxysmal nocturnal dyspnea. Positive for chest pain (no recent symptoms).  Dermatological: No rash, lesions/masses Respiratory: No cough, dyspnea Urologic: No hematuria, dysuria Abdominal:   No nausea, vomiting, diarrhea, bright red blood per rectum, melena, or hematemesis Neurologic:  No visual changes, wkns, changes in mental status. Positive for dizziness.   All other systems reviewed and are otherwise negative except as noted above.   Physical Exam:    VS:  BP 116/66   Pulse 94   Ht 6\' 2"  (1.88 m)  Wt 235 lb 6.4 oz (106.8 kg)   SpO2 95%   BMI 30.22 kg/m    General: Well developed, well nourished,male appearing in no acute distress. Head: Normocephalic, atraumatic. Neck: No carotid bruits. JVD not elevated.  Lungs: Respirations regular and unlabored, without wheezes or rales.  Heart: Regular rate and rhythm. No S3 or S4.  No murmur, no rubs, or gallops appreciated. Abdomen: Appears non-distended. No obvious abdominal masses. Msk:  Strength and tone appear normal for age. No obvious joint deformities or effusions. Extremities: No clubbing or cyanosis. No lower extremity edema.  Distal pedal pulses are 2+ bilaterally. Neuro: Alert and oriented X 3. Moves all extremities spontaneously. No focal deficits noted. Psych:  Responds to questions appropriately with a normal affect. Skin: No rashes or lesions noted  Wt Readings from Last 3 Encounters:  10/02/19 235 lb 6.4 oz (106.8 kg)  08/21/19 241 lb (109.3 kg)  06/04/19 237 lb (107.5 kg)     Studies/Labs Reviewed:   EKG:  EKG is not ordered today.    Recent Labs: 10/01/2019: BUN 18; Creatinine, Ser 0.79; Potassium 3.3; Sodium 136   Lipid Panel No results found for: CHOL, TRIG,  HDL, CHOLHDL, VLDL, LDLCALC, LDLDIRECT  Additional studies/ records that were reviewed today include:   Echocardiogram: 10/2016 Study Conclusions   - Left ventricle: The cavity size was normal. Wall thickness was  increased increased in a pattern of mild to moderate LVH.  Systolic function was normal. The estimated ejection fraction was  in the range of 60% to 65%. Wall motion was normal; there were no  regional wall motion abnormalities. Left ventricular diastolic  function parameters were normal.  - Aortic valve: Valve area (VTI): 2.7 cm^2. Valve area (Vmax): 3.26  cm^2.  - Technically adequate study.   Renal Dopplers: 03/2017 Renal:    Right: Normal size right kidney. No evidence of right renal artery     stenosis. RRV flow present. Normal right Resisitive Index.     Normal cortical thickness of right kidney.  Left: Normal size of left kidney. Normal left Resistive Index.     Normal cortical thickness of the left kidney. No evidence of     left renal artery stenosis. LRV flow present.  Mesenteric:  Normal Celiac artery and Superior Mesenteric artery findings.   Coronary CT: 05/2019 FINDINGS: Non-cardiac: [facility].  Pulmonary veins drain normally to the left atrium.  Calcium Score: 0 Agatston units.  Coronary Arteries: Right dominant with no anomalies  LM: No plaque or stenosis.  LAD system:  No plaque or stenosis.  Circumflex system: Small to moderate ramus present. No plaque or stenosis in LCx system.  RCA system: No plaque or stenosis.  IMPRESSION: 1.Coronary artery calcium score 0 Agatston units, suggesting low risk for future cardiac events.  2.  No significant coronary disease noted.    Assessment:    1. Essential hypertension   2. Hypokalemia   3. Chest pain, unspecified type      Plan:   In order of problems listed above:  1. HTN - He has a history of resistant  hypertension and secondary causes of this have been ruled out as outlined above. His BP is well controlled during today's visit at 116/66 and he is concerned about the accuracy of his home BP cuff as this has been elevated when checked at home. He was encouraged to bring his cuff to his next visit. - Given his well-controlled readings, will continue his current  medication regimen with Amlodipine 10 mg daily, Coreg 6.25 mg twice daily, Spironolactone 25 mg daily, Valsartan 320 mg daily and Chlorthalidone 12.5 mg daily.  He does report intermittent episodes of dizziness with positional changes and would not further titrate his medications for now given concerns for orthostasis. He was encouraged to utilize compression stockings if able and the continued importance of adequate hydration was reviewed  2.  Hypokalemia - K+ was low at 3.3 by recent labs on 10/01/2019. I did advise him to take K-dur 20 mEq for 5 days.  He will try to increase his intake of potassium rich foods. Will recheck a BMET in 1 month to see if long-term K+ replacement is indicated.   3. History of Chest Pain - Recent Coronary CT in 05/2019 showed a calcium score of 0.  His episodes that occur at night seem most typical for GERD and he does remain on Prilosec 20 mg daily.    Medication Adjustments/Labs and Tests Ordered: Current medicines are reviewed at length with the patient today.  Concerns regarding medicines are outlined above.  Medication changes, Labs and Tests ordered today are listed in the Patient Instructions below. Patient Instructions  Medication Instructions:  Your physician has recommended you make the following change in your medication:  Take Potassium 20 meq daily for 5 days.    *If you need a refill on your cardiac medications before your next appointment, please call your pharmacy*   Lab Work: Your physician recommends that you return for lab work in: 1 Month ( 11-03-19)   If you have labs (blood work) drawn  today and your tests are completely normal, you will receive your results only by: Marland Kitchen MyChart Message (if you have MyChart) OR . A paper copy in the mail If you have any lab test that is abnormal or we need to change your treatment, we will call you to review the results.   Testing/Procedures: NONE    Follow-Up: At [facility], you and your health needs are our priority.  As part of our continuing mission to provide you with exceptional heart care, we have created designated Provider Care Teams.  These Care Teams include your primary Cardiologist (physician) and Advanced Practice Providers (APPs -  Physician Assistants and Nurse Practitioners) who all work together to provide you with the care you need, when you need it.  We recommend signing up for the patient portal called "MyChart".  Sign up information is provided on this After Visit Summary.  MyChart is used to connect with patients for Virtual Visits (Telemedicine).  Patients are able to view lab/test results, encounter notes, upcoming appointments, etc.  Non-urgent messages can be sent to your provider as well.   To learn more about what you can do with MyChart, go to [email or web address].    Your next appointment:   6 month(s)  The format for your next appointment:   In Person  Provider:   You may see the MD or one of the following Advanced Practice Providers on your designated Care Team:    Randall An, PA-C   Jacolyn Reedy, PA-C   Other Instructions Thank you for choosing [facility]!  Signed, Ellsworth Lennox, PA-C  10/02/2019 5:11 PM     [facility] [address] [place], [place] [address] Phone: [phone] Fax: [phone]

## 2019-10-02 ENCOUNTER — Ambulatory Visit (INDEPENDENT_AMBULATORY_CARE_PROVIDER_SITE_OTHER): Payer: 59 | Admitting: Student

## 2019-10-02 ENCOUNTER — Other Ambulatory Visit: Payer: Self-pay

## 2019-10-02 ENCOUNTER — Encounter: Payer: Self-pay | Admitting: Student

## 2019-10-02 VITALS — BP 116/66 | HR 94 | Ht 74.0 in | Wt 235.4 lb

## 2019-10-02 DIAGNOSIS — E876 Hypokalemia: Secondary | ICD-10-CM | POA: Diagnosis not present

## 2019-10-02 DIAGNOSIS — R079 Chest pain, unspecified: Secondary | ICD-10-CM

## 2019-10-02 DIAGNOSIS — I10 Essential (primary) hypertension: Secondary | ICD-10-CM | POA: Diagnosis not present

## 2019-10-02 MED ORDER — POTASSIUM CHLORIDE CRYS ER 20 MEQ PO TBCR
20.0000 meq | EXTENDED_RELEASE_TABLET | Freq: Every day | ORAL | 0 refills | Status: DC
Start: 2019-10-02 — End: 2020-01-23

## 2019-10-02 MED FILL — POTASSIUM CHLORIDE CRYS ER: 20 | 5 days supply | Qty: 5 | Fill #0

## 2019-10-02 NOTE — Patient Instructions (Addendum)
Medication Instructions:  Your physician has recommended you make the following change in your medication:  Take Potassium 20 meq daily for 5 days.    *If you need a refill on your cardiac medications before your next appointment, please call your pharmacy*   Lab Work: Your physician recommends that you return for lab work in: 1 Month ( 11-03-19)   If you have labs (blood work) drawn today and your tests are completely normal, you will receive your results only by: Marland Kitchen. MyChart Message (if you have MyChart) OR . A paper copy in the mail If you have any lab test that is abnormal or we need to change your treatment, we will call you to review the results.   Testing/Procedures: NONE    Follow-Up: At [facility], you and your health needs are our priority.  As part of our continuing mission to provide you with exceptional heart care, we have created designated Provider Care Teams.  These Care Teams include your primary Cardiologist (physician) and Advanced Practice Providers (APPs -  Physician Assistants and Nurse Practitioners) who all work together to provide you with the care you need, when you need it.  We recommend signing up for the patient portal called "MyChart".  Sign up information is provided on this After Visit Summary.  MyChart is used to connect with patients for Virtual Visits (Telemedicine).  Patients are able to view lab/test results, encounter notes, upcoming appointments, etc.  Non-urgent messages can be sent to your provider as well.   To learn more about what you can do with MyChart, go to [email or web address].    Your next appointment:   6 month(s)  The format for your next appointment:   In Person  Provider:   You may see the MD or one of the following Advanced Practice Providers on your designated Care Team:    Randall AnBrittany Yvan Dority, PA-C   Jacolyn ReedyMichele Lenze, PA-C     Other Instructions Thank you for choosing [facility]!    Potassium Content of  Foods  Potassium is a mineral found in many foods and drinks. It affects how the heart works, and helps keep fluids and minerals balanced in the body. The amount of potassium you need each day depends on your age and any medical conditions you may have. Talk to your health care provider or dietitian about how much potassium you need. The following lists of foods provide the general serving size for foods and the approximate amount of potassium in each serving, listed in milligrams (mg). Actual values may vary depending on the product and how it is processed. High in potassium The following foods and beverages have 200 mg or more of potassium per serving:  Apricots (raw) - 2 have 200 mg of potassium.  Apricots (dry) - 5 have 200 mg of potassium.  Artichoke - 1 medium has 345 mg of potassium.  Avocado -  fruit has 245 mg of potassium.  Banana - 1 medium fruit has 425 mg of potassium.  Lima or baked beans (canned) -  cup has 280 mg of potassium.  White beans (canned) -  cup has 595 mg potassium.  Beef roast - 3 oz has 320 mg of potassium.  Ground beef - 3 oz has 270 mg of potassium.  Beets (raw or cooked) -  cup has 260 mg of potassium.  Bran muffin - 2 oz has 300 mg of potassium.  Broccoli (cooked) -  cup has 230 mg of potassium.  Brussels sprouts -  cup has 250 mg of potassium.  Cantaloupe -  cup has 215 mg of potassium.  Cereal, 100% bran -  cup has 200-400 mg of potassium.  Cheeseburger -1 single fast food burger has 225-400 mg of potassium.  Chicken - 3 oz has 220 mg of potassium.  Clams (canned) - 3 oz has 535 mg of potassium.  Crab - 3 oz has 225 mg of potassium.  Dates - 5 have 270 mg of potassium.  Dried beans and peas -  cup has 300-475 mg of potassium.  Figs (dried) - 2 have 260 mg of potassium.  Fish (halibut, tuna, cod, snapper) - 3 oz has 480 mg of potassium.  Fish (salmon, haddock, swordfish, perch) - 3 oz has 300 mg of potassium.  Fish  (tuna, canned) - 3 oz has 200 mg of potassium.  [place] fries (fast food) - 3 oz has 470 mg of potassium.  Granola with fruit and nuts -  cup has 200 mg of potassium.  Grapefruit juice -  cup has 200 mg of potassium.  Honeydew melon -  cup has 200 mg of potassium.  Kale (raw) - 1 cup has 300 mg of potassium.  Kiwi - 1 medium fruit has 240 mg of potassium.  Kohlrabi, rutabaga, parsnips -  cup has 280 mg of potassium.  Lentils -  cup has 365 mg of potassium.  Mango - 1 each has 325 mg of potassium.  Milk (nonfat, low-fat, whole, buttermilk) - 1 cup has 350-380 mg of potassium.  Milk (chocolate) - 1 cup has 420 mg of potassium  Molasses - 1 Tbsp has 295 mg of potassium.  Mushrooms -  cup has 280 mg of potassium.  Nectarine - 1 each has 275 mg of potassium.  Nuts (almonds, peanuts, hazelnuts, [place], cashew, mixed) - 1 oz has 200 mg of potassium.  Nuts (pistachios) - 1 oz has 295 mg of potassium.  Orange - 1 fruit has 240 mg of potassium.  Orange juice -  cup has 235 mg of potassium.  Papaya -  medium fruit has 390 mg of potassium.  Peanut butter (chunky) - 2 Tbsp has 240 mg of potassium.  Peanut butter (smooth) - 2 Tbsp has 210 mg of potassium.  Pear - 1 medium (200 mg) of potassium.  Pomegranate - 1 whole fruit has 400 mg of potassium.  Pomegranate juice -  cup has 215 mg of potassium.  Pork - 3 oz has 350 mg of potassium.  Potato chips (salted) - 1 oz has 465 mg of potassium.  Potato (baked with skin) - 1 medium has 925 mg of potassium.  Potato (boiled) -  cup has 255 mg of potassium.  Potato (Mashed) -  cup has 330 mg of potassium.  Prune juice -  cup has 370 mg of potassium.  Prunes - 5 have 305 mg of potassium.  Pudding (chocolate) -  cup has 230 mg of potassium.  Pumpkin (canned) -  cup has 250 mg of potassium.  Raisins (seedless) -  cup has 270 mg of potassium.  Seeds (sunflower or pumpkin) - 1 oz has 240 mg of potassium.  Soy  milk - 1 cup has 300 mg of potassium.  Spinach (cooked) - 1/2 cup has 420 mg of potassium.  Spinach (canned) -  cup has 370 mg of potassium.  Sweet potato (baked with skin) - 1 medium has 450 mg of potassium.  Swiss chard -  cup has 480 mg of potassium.  Tomato  or vegetable juice -  cup has 275 mg of potassium.  Tomato (sauce or puree) -  cup has 400-550 mg of potassium.  Tomato (raw) - 1 medium has 290 mg of potassium.  Tomato (canned) -  cup has 200-300 mg of potassium.  [place] - 3 oz has 250 mg of potassium.  Wheat germ - 1 oz has 250 mg of potassium.  Winter squash -  cup has 250 mg of potassium.  Yogurt (plain or fruited) - 6 oz has 260-435 mg of potassium.  Zucchini -  cup has 220 mg of potassium. Moderate in potassium The following foods and beverages have 50-200 mg of potassium per serving:  Apple - 1 fruit has 150 mg of potassium  Apple juice -  cup has 150 mg of potassium  Applesauce -  cup has 90 mg of potassium  Apricot nectar -  cup has 140 mg of potassium  Asparagus (small spears) -  cup has 155 mg of potassium  Asparagus (large spears) - 6 have 155 mg of potassium  Bagel (cinnamon raisin) - 1 four-inch bagel has 130 mg of potassium  Bagel (egg or plain) - 1 four- inch bagel has 70 mg of potassium  Beans (green) -  cup has 90 mg of potassium  Beans (yellow) -  cup has 190 mg of potassium  Beer, regular - 12 oz has 100 mg of potassium  Beets (canned) -  cup has 125 mg of potassium  Blackberries -  cup has 115 mg of potassium  Blueberries -  cup has 60 mg of potassium  Bread (whole wheat) - 1 slice has 70 mg of potassium  Broccoli (raw) -  cup has 145 mg of potassium  Cabbage -  cup has 150 mg of potassium  Carrots (cooked or raw) -  cup has 180 mg of potassium  Cauliflower (raw) -  cup has 150 mg of potassium  Celery (raw) -  cup has 155 mg of potassium  Cereal, bran flakes -  cup has 120-150 mg of  potassium  Cheese (cottage) -  cup has 110 mg of potassium  Cherries - 10 have 150 mg of potassium  Chocolate - 1 oz bar has 165 mg of potassium  Coffee (brewed) - 6 oz has 90 mg of potassium  Corn -  cup or 1 ear has 195 mg of potassium  Cucumbers -  cup has 80 mg of potassium  Egg - 1 large egg has 60 mg of potassium  Eggplant -  cup has 60 mg of potassium  Endive (raw) -  cup has 80 mg of potassium  English muffin - 1 has 65 mg of potassium  Fish (ocean perch) - 3 oz has 192 mg of potassium  Frankfurter, beef or pork - 1 has 75 mg of potassium  Fruit cocktail -  cup has 115 mg of potassium  Grape juice -  cup has 170 mg of potassium  Grapefruit -  fruit has 175 mg of potassium  Grapes -  cup has 155 mg of potassium  Greens: kale, turnip, collard -  cup has 110-150 mg of potassium  Ice cream or frozen yogurt (chocolate) -  cup has 175 mg of potassium  Ice cream or frozen yogurt (vanilla) -  cup has 120-150 mg of potassium  Lemons, limes - 1 each has 80 mg of potassium  Lettuce - 1 cup has 100 mg of potassium  Mixed vegetables -  cup has 150 mg  of potassium  Mushrooms, raw -  cup has 110 mg of potassium  Nuts (walnuts, pecans, or macadamia) - 1 oz has 125 mg of potassium  Oatmeal -  cup has 80 mg of potassium  Okra -  cup has 110 mg of potassium  Onions -  cup has 120 mg of potassium  Peach - 1 has 185 mg of potassium  Peaches (canned) -  cup has 120 mg of potassium  Pears (canned) -  cup has 120 mg of potassium  Peas, green (frozen) -  cup has 90 mg of potassium  Peppers (Green) -  cup has 130 mg of potassium  Peppers (Red) -  cup has 160 mg of potassium  Pineapple juice -  cup has 165 mg of potassium  Pineapple (fresh or canned) -  cup has 100 mg of potassium  Plums - 1 has 105 mg of potassium  Pudding, vanilla -  cup has 150 mg of potassium  Raspberries -  cup has 90 mg of potassium  Rhubarb -  cup has 115  mg of potassium  Rice, wild -  cup has 80 mg of potassium  Shrimp - 3 oz has 155 mg of potassium  Spinach (raw) - 1 cup has 170 mg of potassium  Strawberries -  cup has 125 mg of potassium  Summer squash -  cup has 175-200 mg of potassium  Swiss chard (raw) - 1 cup has 135 mg of potassium  Tangerines - 1 fruit has 140 mg of potassium  Tea, brewed - 6 oz has 65 mg of potassium  Turnips -  cup has 140 mg of potassium  Watermelon -  cup has 85 mg of potassium  Wine (Red, table) - 5 oz has 180 mg of potassium  Wine (White, table) - 5 oz 100 mg of potassium Low in potassium The following foods and beverages have less than 50 mg of potassium per serving.  Bread (white) - 1 slice has 30 mg of potassium  Carbonated beverages - 12 oz has less than 5 mg of potassium  Cheese - 1 oz has 20-30 mg of potassium  Cranberries -  cup has 45 mg of potassium  Cranberry juice cocktail -  cup has 20 mg of potassium  Fats and oils - 1 Tbsp has less than 5 mg of potassium  Hummus - 1 Tbsp has 32 mg of potassium  Nectar (papaya, mango, or pear) -  cup has 35 mg of potassium  Rice (white or brown) -  cup has 50 mg of potassium  Spaghetti or macaroni (cooked) -  cup has 30 mg of potassium  Tortilla, flour or corn - 1 has 50 mg of potassium  Waffle - 1 four-inch waffle has 50 mg of potassium  Water chestnuts -  cup has 40 mg of potassium Summary  Potassium is a mineral found in many foods and drinks. It affects how the heart works, and helps keep fluids and minerals balanced in the body.  The amount of potassium you need each day depends on your age and any existing medical conditions you may have. Your health care provider or dietitian may recommend an amount of potassium that you should have each day. This information is not intended to replace advice given to you by your health care provider. Make sure you discuss any questions you have with your health care  provider. Document Revised: 01/05/2017 Document Reviewed: 04/19/2016 Elsevier Patient Education  2020 [employer].

## 2019-10-03 ENCOUNTER — Ambulatory Visit: Payer: 59

## 2019-10-06 ENCOUNTER — Telehealth: Payer: Self-pay

## 2019-10-06 NOTE — Telephone Encounter (Signed)
-----   Message from Pearletha Furl, RPH-CPP sent at 10/03/2019  7:46 AM EDT ----- Regarding: FW: Reschedule Visit Please call this patient and re-schedule fro HNT follow up in 1 month.  Thanks Raquel ----- Message ----- From: Ellsworth Lennox, PA-C Sent: 10/02/2019   5:08 PM EDT To: Pearletha Furl, RPH-CPP Subject: Reschedule Visit                               Hi Raquel,   I hope you are doing well!  This patient had an appointment with me today and also had an appointment scheduled with you tomorrow as part of the HTN Clinic. His BP was overall well controlled with his current regimen. He did have hypokalemia by recent labs and I did start him on potassium supplementation with plans for a repeat BMET. I canceled his visit for tomorrow since he was just evaluated today but can you please reschedule him to see you in 1-2 months for reassessment?  Thanks,  [place]

## 2019-10-06 NOTE — Telephone Encounter (Signed)
Called and lmomed the pt t let them know that we needed to reschedule the 1 monthe f/u for htn clinic w/pharmd

## 2019-10-09 MED FILL — OMEPRAZOLE DR 20 MG CAPSULE: 20 | 30 days supply | Qty: 30 | Fill #3

## 2019-10-14 ENCOUNTER — Telehealth: Payer: Self-pay | Admitting: *Deleted

## 2019-10-14 NOTE — Addendum Note (Signed)
Addended by: Regis Bill B on: 10/14/2019 06:05 PM   Modules accepted: Level of Service, SmartSet

## 2019-10-14 NOTE — Telephone Encounter (Signed)
This encounter was created in error - please disregard.

## 2019-10-14 NOTE — Telephone Encounter (Signed)
-----   Message from Chilton Si, MD sent at 10/10/2019  4:50 PM EDT ----- Normal kidney function.  Potassium is low. Potassium 20 mEq daily.

## 2019-10-29 ENCOUNTER — Other Ambulatory Visit: Payer: Self-pay | Admitting: Cardiovascular Disease

## 2019-10-29 ENCOUNTER — Other Ambulatory Visit: Payer: Self-pay | Admitting: Student

## 2019-10-29 MED FILL — CHLORTHALIDONE 25 MG TABS: 25 | 30 days supply | Qty: 15 | Fill #0

## 2019-11-03 ENCOUNTER — Other Ambulatory Visit: Payer: Self-pay | Admitting: *Deleted

## 2019-11-03 MED ORDER — CARVEDILOL 6.25 MG PO TABS: 6.2500 mg | ORAL_TABLET | Freq: Two times a day (BID) | ORAL | 3 refills | Status: DC

## 2019-11-03 MED FILL — CARVEDILOL 6.25 MG TABLET: 6.25 | 90 days supply | Qty: 180 | Fill #0

## 2019-11-04 MED FILL — AMLODIPINE BESYLATE 10 MG T: 10 | 90 days supply | Qty: 90 | Fill #1

## 2019-11-04 MED FILL — OMEPRAZOLE DR 20 MG CAPSULE: 20 | 30 days supply | Qty: 30 | Fill #4

## 2019-11-14 ENCOUNTER — Ambulatory Visit: Payer: 59

## 2019-11-27 MED FILL — CHLORTHALIDONE 25 MG TABS: 25 | 30 days supply | Qty: 15 | Fill #1

## 2019-11-27 MED FILL — SPIRONOLACTONE 25 MG TABS: 25 | 90 days supply | Qty: 90 | Fill #1

## 2019-12-10 MED FILL — OMEPRAZOLE DR 20 MG CAPSULE: 20 | 30 days supply | Qty: 30 | Fill #5

## 2019-12-29 ENCOUNTER — Other Ambulatory Visit: Payer: Self-pay | Admitting: Cardiovascular Disease

## 2019-12-29 MED FILL — VALSARTAN 320 MG TAB: 320 | 90 days supply | Qty: 90 | Fill #0

## 2019-12-31 ENCOUNTER — Other Ambulatory Visit: Payer: Self-pay | Admitting: Student

## 2019-12-31 MED FILL — CHLORTHALIDONE 25 MG TABS: 25 | 90 days supply | Qty: 45 | Fill #0

## 2019-12-31 NOTE — Telephone Encounter (Signed)
This is a Melissa pt.  °

## 2020-01-14 MED FILL — OMEPRAZOLE DR 20 MG CAPSULE: 20 | 30 days supply | Qty: 30 | Fill #0

## 2020-01-23 ENCOUNTER — Ambulatory Visit (INDEPENDENT_AMBULATORY_CARE_PROVIDER_SITE_OTHER): Payer: 59 | Admitting: Pharmacist Clinician (PhC)/ Clinical Pharmacy Specialist

## 2020-01-23 ENCOUNTER — Encounter: Payer: Self-pay | Admitting: Pharmacist Clinician (PhC)/ Clinical Pharmacy Specialist

## 2020-01-23 ENCOUNTER — Other Ambulatory Visit: Payer: Self-pay

## 2020-01-23 DIAGNOSIS — I10 Essential (primary) hypertension: Secondary | ICD-10-CM

## 2020-01-23 NOTE — Patient Instructions (Signed)
Return for a a follow up appointment Friday January 21 at 9:30 am  Check your blood pressure at home 3-4 days each week and keep record of the readings.  Take your BP meds as follows:  AM:  Valsartan 320 mg, chlorthalidone 12.5 mg, carvedilol 6.25 mg  PM:  Amlodipine 10 mg, spironolactone 25 mg, carvedilol 6.25 mg  Bring all of your meds, your BP cuff and your record of home blood pressures to your next appointment.  Exercise as you're able, try to walk approximately 30 minutes per day.  Keep salt intake to a minimum, especially watch canned and prepared boxed foods.  Eat more fresh fruits and vegetables and fewer canned items.  Avoid eating in fast food restaurants.    HOW TO TAKE YOUR BLOOD PRESSURE: . Rest 5 minutes before taking your blood pressure. .  Don't smoke or drink caffeinated beverages for at least 30 minutes before. . Take your blood pressure before (not after) you eat. . Sit comfortably with your back supported and both feet on the floor (don't cross your legs). . Elevate your arm to heart level on a table or a desk. . Use the proper sized cuff. It should fit smoothly and snugly around your bare upper arm. There should be enough room to slip a fingertip under the cuff. The bottom edge of the cuff should be 1 inch above the crease of the elbow. . Ideally, take 3 measurements at one sitting and record the average.

## 2020-01-23 NOTE — Progress Notes (Signed)
Patient ID: Benjamin Chang                 DOB: May 22, 1995                      MRN: [id number]     HPI: Benjamin Chang is a 24 y.o. male referred by Dr. Duke Salvia to HTN clinic. PMH includes resistance hypertension (dx with HTN at age 60), hx of syncope, GERD, and atypical chest pain. He had the appropriate work up for secondary causes, including renal artery Dopplers and screening for pheochromocytoma and hyperaldosteronism. Thyroid was normal 02/2019. Losartan was changed to valsartan by Dr. Duke Salvia on 06/04/2019. Patient instructed to follow up with PharmD in 1 month, but patient didn't show for his appointment. Also refused Care Guidance assitant for smoking cessation.  He was seen in [facility] in July, at which time the HCTZ was switched to chlorthalidone.  Labs done after the change showed his potassium decreased to 3.3.  He was seen by Randall An PA about that same time and was given a 5 day course of potassium and asked to increase potassium rich foods.  He has not had a metabolic panel done since then.  At that visit he was well controlled at 116/66  Takes all meds once daily, no evening dose of carvedilol.  Girlfriend monitors meds First diagnosed at 48 when started high school.  Started taking meds at about same time No current problems, no side effects  Current HTN meds:  Amlodipine 10mg  daily Carvedilol 6.25mg  twice daily Chlorthalidone 12.5 mg daily  Spironolactone 25mg  daily Valsartan 320mg  daily   Previously tried:  Lisinopril - face swelling Clonidine - groggy Losartan/HCT - switched to valsartan and chlorthalidone  BP goal: <130/80  Family History: The patient's family history includes Aneurysm in his maternal grandfather; Dementia in his paternal grandfather; Diabetes in his paternal grandfather; Heart attack in his paternal grandfather; Heart disease in his father, maternal grandmother, mother, and another family member; Hypertension in his father, maternal  grandmother, mother, paternal grandfather, and paternal grandmother.  Social History: occasional alcohol, current smoker (1/2 ppd of cigarettes), down 2 caffeinated drinks per day  Diet: most meals at home and limits sodium intake, mostly home lunches, watches salt intake, tries to avoid fried foods, plenty of fruits and vegetables   Exercise:walks lots at work and physical work (coinryuctions)  Home BP readings: 130-140's mostly, no > 160 in past few weeks, 120's x 1-2 only diastolic mostly 80's   Wt Readings from Last 3 Encounters:  01/23/20 234 lb (106.1 kg)  10/02/19 235 lb 6.4 oz (106.8 kg)  08/21/19 241 lb (109.3 kg)   BP Readings from Last 3 Encounters:  01/23/20 130/84  10/02/19 116/66  08/21/19 138/80   Pulse Readings from Last 3 Encounters:  01/23/20 88  10/02/19 94  06/04/19 99     Past Medical History:  Diagnosis Date  . Atypical chest pain 06/21/2019  . Essential hypertension 06/21/2019  . GERD (gastroesophageal reflux disease) 06/21/2019  . Hypertension     Current Outpatient Medications on File Prior to Visit  Medication Sig Dispense Refill  . amLODipine (NORVASC) 10 MG tablet TAKE 1 TABLET (10 MG TOTAL) BY MOUTH DAILY. 90 tablet 2  . carvedilol (COREG) 6.25 MG tablet Take 1 tablet (6.25 mg total) by mouth 2 (two) times daily. 180 tablet 3  . cetirizine (ZYRTEC) 10 MG tablet Take 10 mg by mouth daily.    . chlorthalidone (HYGROTON)  25 MG tablet TAKE 1/2 TABLET BY MOUTH DAILY (REPLACES HYDROCHLOROTHIAZIDE) 45 tablet 3  . Multiple Vitamins-Minerals (MULTIVITAMIN WITH MINERALS) tablet Take 1 tablet by mouth daily.    . Omega-3 Fatty Acids (OMEGA-3 2100 PO) Take by mouth.    Marland Kitchen omeprazole (PRILOSEC) 20 MG capsule Take 20 mg by mouth daily.    Marland Kitchen spironolactone (ALDACTONE) 25 MG tablet Take 1 tablet (25 mg total) by mouth daily. 90 tablet 3  . valsartan (DIOVAN) 320 MG tablet TAKE 1 TABLET (320 MG TOTAL) BY MOUTH DAILY. 90 tablet 1   No current  facility-administered medications on file prior to visit.    Allergies  Allergen Reactions  . Lisinopril     Lips swell    Blood pressure 130/84, pulse 88, height 6\' 2"  (1.88 m), weight 234 lb (106.1 kg).  Essential hypertension Patient with essential hypertension, close to goal today, but notes higher readings at home.  He is not taking his carvedilol twice daily, and takes all meds at same time in the mornings.  Will have him move amlodipine and spironolactone to evenings, to be taken with second dose of carvedilol.  He will continue with carvedilol, valsartan and chlorthalidone in the mornings.  I asked that he record home BP readings most days of the week and bring that information, along with his home meter, to his next appointment.  We will see him back in a month.    PharmD CPP [facility] [address] [place] 01/23/2020 2:07 PM

## 2020-01-23 NOTE — Assessment & Plan Note (Signed)
Patient with essential hypertension, close to goal today, but notes higher readings at home.  He is not taking his carvedilol twice daily, and takes all meds at same time in the mornings.  Will have him move amlodipine and spironolactone to evenings, to be taken with second dose of carvedilol.  He will continue with carvedilol, valsartan and chlorthalidone in the mornings.  I asked that he record home BP readings most days of the week and bring that information, along with his home meter, to his next appointment.  We will see him back in a month.

## 2020-02-11 MED FILL — AMLODIPINE BESYLATE 10 MG T: 10 | 90 days supply | Qty: 90 | Fill #2

## 2020-02-11 MED FILL — OMEPRAZOLE DR 20 MG CAPSULE: 20 | 30 days supply | Qty: 30 | Fill #1

## 2020-02-27 ENCOUNTER — Ambulatory Visit: Payer: 59

## 2020-04-01 ENCOUNTER — Encounter: Payer: Self-pay | Admitting: Cardiology

## 2020-04-01 NOTE — Progress Notes (Signed)
Cardiology Office Note  Date: 04/02/2020   ID: Benjamin Chang, DOB 25-Oct-1995, MRN [id number]  PCP:  Selinda Flavin, MD  Cardiologist:  Nona Dell, MD Electrophysiologist:  None   Chief Complaint  Patient presents with  . Hypertension    History of Present Illness: Benjamin Chang is a 25 y.o. male former patient of Dr. Purvis Sheffield now presenting to establish follow-up with me.  I reviewed his records and updated the chart.  He was last seen in August 2021 by Ms. Strader PA-C as well as in the hypertension clinic in December 2021.  He has essential hypertension, secondary causes ruled out based on previous work-up.  He presents today reporting compliance with his medications, does need refills.  He does not describe any major functional limitations.  Works in [employer] information [employer].  Also enjoys hunting and being outdoors, he raises beef cattle.  I reviewed his ECG today which shows normal sinus rhythm.  Past Medical History:  Diagnosis Date  . Atypical chest pain   . Essential hypertension   . GERD (gastroesophageal reflux disease)     Past Surgical History:  Procedure Laterality Date  . LAPAROSCOPIC APPENDECTOMY N/A 11/04/2015   Procedure: APPENDECTOMY LAPAROSCOPIC;  Surgeon: Ancil Linsey, MD;  Location: [facility];  Service: General;  Laterality: N/A;  . TONSILLECTOMY      Current Outpatient Medications  Medication Sig Dispense Refill  . cetirizine (ZYRTEC) 10 MG tablet Take 10 mg by mouth daily.    . Multiple Vitamins-Minerals (MULTIVITAMIN WITH MINERALS) tablet Take 1 tablet by mouth daily.    . Omega-3 Fatty Acids (OMEGA-3 2100 PO) Take by mouth.    Marland Kitchen omeprazole (PRILOSEC) 20 MG capsule Take 20 mg by mouth daily.    Marland Kitchen amLODipine (NORVASC) 10 MG tablet Take 1 tablet (10 mg total) by mouth daily. 90 tablet 2  . carvedilol (COREG) 6.25 MG tablet Take 1 tablet (6.25 mg total) by mouth 2 (two) times daily. 180 tablet 3  . chlorthalidone (HYGROTON) 25 MG tablet  TAKE 1/2 TABLET BY MOUTH DAILY (REPLACES HYDROCHLOROTHIAZIDE) 45 tablet 3  . spironolactone (ALDACTONE) 25 MG tablet Take 1 tablet (25 mg total) by mouth daily. 90 tablet 3  . valsartan (DIOVAN) 320 MG tablet Take 1 tablet (320 mg total) by mouth daily. 90 tablet 1   No current facility-administered medications for this visit.   Allergies:  Lisinopril   ROS: No palpitations or syncope.  Physical Exam: VS:  BP (!) 138/96   Pulse 84   Ht 6\' 2"  (1.88 m)   Wt 244 lb (110.7 kg)   SpO2 97%   BMI 31.33 kg/m , BMI Body mass index is 31.33 kg/m.  Wt Readings from Last 3 Encounters:  04/02/20 244 lb (110.7 kg)  01/23/20 234 lb (106.1 kg)  10/02/19 235 lb 6.4 oz (106.8 kg)    General: Patient appears comfortable at rest. HEENT: Conjunctiva and lids normal, wearing a mask. Neck: Supple, no elevated JVP. Lungs: Clear to auscultation, nonlabored breathing at rest. Cardiac: Regular rate and rhythm, no S3 or significant systolic murmur, no pericardial rub. Extremities: No pitting edema.  ECG:  An ECG dated 11/16/2017 was personally reviewed today and demonstrated:  Sinus rhythm.  Recent Labwork: 10/01/2019: BUN 18; Creatinine, Ser 0.79; Potassium 3.3; Sodium 136   Other Studies Reviewed Today:  Echocardiogram 10/31/2016: - Left ventricle: The cavity size was normal. Wall thickness was  increased increased in a pattern of mild to moderate LVH.  Systolic  function was normal. The estimated ejection fraction was  in the range of 60% to 65%. Wall motion was normal; there were no  regional wall motion abnormalities. Left ventricular diastolic  function parameters were normal.  - Aortic valve: Valve area (VTI): 2.7 cm^2. Valve area (Vmax): 3.26  cm^2.  - Technically adequate study.  Assessment and Plan:  Essential hypertension, previous work-up for secondary causes of resistant hypertension was reassuring.  He was seen in the hypertension clinic in [place], current  medications reviewed.  Discussed compliance with therapy, refills provided.  Also discussed diet and weight loss as well as regular activity.  He continues to follow at [facility] for primary care.  Medication Adjustments/Labs and Tests Ordered: Current medicines are reviewed at length with the patient today.  Concerns regarding medicines are outlined above.   Tests Ordered: Orders Placed This Encounter  Procedures  . Basic metabolic panel  . EKG 12-Lead    Medication Changes: Meds ordered this encounter  Medications  . amLODipine (NORVASC) 10 MG tablet    Sig: Take 1 tablet (10 mg total) by mouth daily.    Dispense:  90 tablet    Refill:  2  . chlorthalidone (HYGROTON) 25 MG tablet    Sig: TAKE 1/2 TABLET BY MOUTH DAILY (REPLACES HYDROCHLOROTHIAZIDE)    Dispense:  45 tablet    Refill:  3  . valsartan (DIOVAN) 320 MG tablet    Sig: Take 1 tablet (320 mg total) by mouth daily.    Dispense:  90 tablet    Refill:  1  . spironolactone (ALDACTONE) 25 MG tablet    Sig: Take 1 tablet (25 mg total) by mouth daily.    Dispense:  90 tablet    Refill:  3  . carvedilol (COREG) 6.25 MG tablet    Sig: Take 1 tablet (6.25 mg total) by mouth 2 (two) times daily.    Dispense:  180 tablet    Refill:  3    Disposition:  Follow up 6 months in the [place] office.  Signed, Jonelle Sidle, MD, [facility] 04/02/2020 2:05 PM    [facility] at [facility] [address] [place], [place] [address] Phone: [phone]; Fax: [phone]

## 2020-04-02 ENCOUNTER — Other Ambulatory Visit: Payer: Self-pay

## 2020-04-02 ENCOUNTER — Encounter: Payer: Self-pay | Admitting: Cardiology

## 2020-04-02 ENCOUNTER — Ambulatory Visit (INDEPENDENT_AMBULATORY_CARE_PROVIDER_SITE_OTHER): Payer: Commercial Managed Care - PPO | Admitting: Cardiology

## 2020-04-02 VITALS — BP 138/96 | HR 84 | Ht 74.0 in | Wt 244.0 lb

## 2020-04-02 DIAGNOSIS — I10 Essential (primary) hypertension: Secondary | ICD-10-CM

## 2020-04-02 MED ORDER — CHLORTHALIDONE 25 MG PO TABS
ORAL_TABLET | ORAL | 3 refills | Status: DC
Start: 2020-04-02 — End: 2021-04-06

## 2020-04-02 MED ORDER — CARVEDILOL 6.25 MG PO TABS
6.2500 mg | ORAL_TABLET | Freq: Two times a day (BID) | ORAL | 3 refills | Status: DC
Start: 2020-04-02 — End: 2021-04-06

## 2020-04-02 MED ORDER — SPIRONOLACTONE 25 MG PO TABS
25.0000 mg | ORAL_TABLET | Freq: Every day | ORAL | 3 refills | Status: DC
Start: 2020-04-02 — End: 2021-04-06

## 2020-04-02 MED ORDER — VALSARTAN 320 MG PO TABS
320.0000 mg | ORAL_TABLET | Freq: Every day | ORAL | 1 refills | Status: DC
Start: 2020-04-02 — End: 2020-10-22

## 2020-04-02 MED ORDER — AMLODIPINE BESYLATE 10 MG PO TABS: 10.0000 mg | ORAL_TABLET | Freq: Every day | ORAL | 2 refills | Status: DC

## 2020-04-02 NOTE — Patient Instructions (Signed)
Medication Instructions:  Your physician recommends that you continue on your current medications as directed. Please refer to the Current Medication list given to you today.  *If you need a refill on your cardiac medications before your next appointment, please call your pharmacy*   Lab Work: BMET , JUST BEFORE NEXT VISIT IN 6 MONTHS  If you have labs (blood work) drawn today and your tests are completely normal, you will receive your results only by: Marland Kitchen MyChart Message (if you have MyChart) OR . A paper copy in the mail If you have any lab test that is abnormal or we need to change your treatment, we will call you to review the results.   Testing/Procedures: None today   Follow-Up: At [facility], you and your health needs are our priority.  As part of our continuing mission to provide you with exceptional heart care, we have created designated Provider Care Teams.  These Care Teams include your primary Cardiologist (physician) and Advanced Practice Providers (APPs -  Physician Assistants and Nurse Practitioners) who all work together to provide you with the care you need, when you need it.  We recommend signing up for the patient portal called "MyChart".  Sign up information is provided on this After Visit Summary.  MyChart is used to connect with patients for Virtual Visits (Telemedicine).  Patients are able to view lab/test results, encounter notes, upcoming appointments, etc.  Non-urgent messages can be sent to your provider as well.   To learn more about what you can do with MyChart, go to [email or web address].    Your next appointment:   6 month(s)  The format for your next appointment:   In Person  Provider:   Nona Dell, MD   Other Instructions None      Thank you for choosing [facility] !

## 2020-05-18 ENCOUNTER — Other Ambulatory Visit (HOSPITAL_COMMUNITY): Payer: Self-pay

## 2020-08-21 ENCOUNTER — Observation Stay (HOSPITAL_COMMUNITY)
Admission: EM | Admit: 2020-08-21 | Discharge: 2020-08-24 | Disposition: A | Discharge status: Home / Self Care | Payer: Commercial Managed Care - PPO | Attending: Emergency Medicine | Admitting: Emergency Medicine

## 2020-08-21 ENCOUNTER — Emergency Department (HOSPITAL_COMMUNITY)
Admission: EM | Admit: 2020-08-21 | Discharge: 2020-08-21 | Disposition: A | Discharge status: Home / Self Care | Payer: Commercial Managed Care - PPO | Source: Home / Self Care

## 2020-08-21 ENCOUNTER — Other Ambulatory Visit: Payer: Self-pay

## 2020-08-21 ENCOUNTER — Emergency Department (HOSPITAL_COMMUNITY): Payer: Commercial Managed Care - PPO

## 2020-08-21 ENCOUNTER — Encounter (HOSPITAL_COMMUNITY): Payer: Self-pay

## 2020-08-21 DIAGNOSIS — R29898 Other symptoms and signs involving the musculoskeletal system: Secondary | ICD-10-CM

## 2020-08-21 DIAGNOSIS — F17210 Nicotine dependence, cigarettes, uncomplicated: Secondary | ICD-10-CM | POA: Insufficient documentation

## 2020-08-21 DIAGNOSIS — R292 Abnormal reflex: Secondary | ICD-10-CM | POA: Diagnosis not present

## 2020-08-21 DIAGNOSIS — R531 Weakness: Secondary | ICD-10-CM | POA: Diagnosis not present

## 2020-08-21 DIAGNOSIS — U071 COVID-19: Secondary | ICD-10-CM | POA: Diagnosis present

## 2020-08-21 DIAGNOSIS — Z79899 Other long term (current) drug therapy: Secondary | ICD-10-CM | POA: Insufficient documentation

## 2020-08-21 DIAGNOSIS — R209 Unspecified disturbances of skin sensation: Secondary | ICD-10-CM

## 2020-08-21 DIAGNOSIS — K219 Gastro-esophageal reflux disease without esophagitis: Secondary | ICD-10-CM | POA: Diagnosis present

## 2020-08-21 DIAGNOSIS — R059 Cough, unspecified: Secondary | ICD-10-CM | POA: Insufficient documentation

## 2020-08-21 DIAGNOSIS — E876 Hypokalemia: Secondary | ICD-10-CM

## 2020-08-21 DIAGNOSIS — Z8249 Family history of ischemic heart disease and other diseases of the circulatory system: Secondary | ICD-10-CM | POA: Diagnosis not present

## 2020-08-21 DIAGNOSIS — Z5321 Procedure and treatment not carried out due to patient leaving prior to being seen by health care provider: Secondary | ICD-10-CM | POA: Insufficient documentation

## 2020-08-21 DIAGNOSIS — I10 Essential (primary) hypertension: Secondary | ICD-10-CM | POA: Diagnosis present

## 2020-08-21 DIAGNOSIS — Z888 Allergy status to other drugs, medicaments and biological substances status: Secondary | ICD-10-CM | POA: Diagnosis not present

## 2020-08-21 LAB — CSF CELL COUNT WITH DIFFERENTIAL
RBC Count, CSF: 38 /mm3 — ABNORMAL HIGH
RBC Count, CSF: 157 /mm3 — ABNORMAL HIGH
Tube #: 4
Tube #: 1
WBC, CSF: 0 /mm3 (ref 0–5)
WBC, CSF: 0 /mm3 (ref 0–5)

## 2020-08-21 LAB — GLUCOSE, CSF: Glucose, CSF: 61 mg/dL (ref 40–70)

## 2020-08-21 LAB — COMPREHENSIVE METABOLIC PANEL
ALT: 37 U/L (ref 0–44)
AST: 38 U/L (ref 15–41)
Albumin: 4.1 g/dL (ref 3.5–5.0)
Alkaline Phosphatase: 56 U/L (ref 38–126)
Anion gap: 10 (ref 5–15)
BUN: 12 mg/dL (ref 6–20)
CO2: 26 mmol/L (ref 22–32)
Calcium: 8.5 mg/dL — ABNORMAL LOW (ref 8.9–10.3)
Chloride: 99 mmol/L (ref 98–111)
Creatinine, Ser: 0.7 mg/dL (ref 0.61–1.24)
GFR, Estimated: >60 mL/min (ref >60)
Glucose, Bld: 108 mg/dL — ABNORMAL HIGH (ref 70–99)
Potassium: 3.1 mmol/L — ABNORMAL LOW (ref 3.5–5.1)
Sodium: 135 mmol/L (ref 135–145)
Total Bilirubin: 0.4 mg/dL (ref 0.3–1.2)
Total Protein: 7.5 g/dL (ref 6.5–8.1)

## 2020-08-21 LAB — CBC WITH DIFFERENTIAL/PLATELET
Abs Immature Granulocytes: 0.01 10*3/uL (ref 0.00–0.07)
Basophils Absolute: 0 10*3/uL (ref 0.0–0.1)
Basophils Relative: 0 %
Eosinophils Absolute: 0 10*3/uL (ref 0.0–0.5)
Eosinophils Relative: 1 %
HCT: 43.8 % (ref 39.0–52.0)
Hemoglobin: 16.3 g/dL (ref 13.0–17.0)
Immature Granulocytes: 0 %
Lymphocytes Relative: 39 %
Lymphs Abs: 2.3 10*3/uL (ref 0.7–4.0)
MCH: 33.3 pg (ref 26.0–34.0)
MCHC: 37.2 g/dL — ABNORMAL HIGH (ref 30.0–36.0)
MCV: 89.4 fL (ref 80.0–100.0)
Monocytes Absolute: 0.6 10*3/uL (ref 0.1–1.0)
Monocytes Relative: 11 %
Neutro Abs: 3 10*3/uL (ref 1.7–7.7)
Neutrophils Relative %: 49 %
Platelets: 162 10*3/uL (ref 150–400)
RBC: 4.9 MIL/uL (ref 4.22–5.81)
RDW: 12 % (ref 11.5–15.5)
WBC: 6 10*3/uL (ref 4.0–10.5)
nRBC: 0 % (ref 0.0–0.2)

## 2020-08-21 LAB — PROTEIN, CSF: Total  Protein, CSF: 21 mg/dL (ref 15–45)

## 2020-08-21 LAB — RESP PANEL BY RT-PCR (FLU A&B, COVID) ARPGX2
Influenza A by PCR: NEGATIVE
Influenza B by PCR: NEGATIVE
SARS Coronavirus 2 by RT PCR: POSITIVE — AB

## 2020-08-21 LAB — GRAM STAIN: Gram Stain: NONE SEEN

## 2020-08-21 LAB — TSH: TSH: 2.963 u[IU]/mL (ref 0.350–4.500)

## 2020-08-21 MED ORDER — LORATADINE 10 MG PO TABS
10.0000 mg | ORAL_TABLET | Freq: Every day | ORAL | Status: DC
  Administered 2020-08-22 – 2020-08-24 (×3): 10 mg via ORAL
  Filled 2020-08-21 (×3): qty 1

## 2020-08-21 MED ORDER — SODIUM CHLORIDE 0.9 % IV BOLUS
1000.0000 mL | Freq: Once | INTRAVENOUS | Status: AC
  Administered 2020-08-21: 1000 mL via INTRAVENOUS

## 2020-08-21 MED ORDER — SODIUM CHLORIDE 0.9 % IV SOLN: INTRAVENOUS | Status: DC

## 2020-08-21 MED ORDER — SPIRONOLACTONE 25 MG PO TABS
25.0000 mg | ORAL_TABLET | Freq: Every day | ORAL | Status: DC
  Administered 2020-08-22 – 2020-08-24 (×3): 25 mg via ORAL
  Filled 2020-08-21 (×3): qty 1

## 2020-08-21 MED ORDER — CARVEDILOL 6.25 MG PO TABS
6.2500 mg | ORAL_TABLET | Freq: Two times a day (BID) | ORAL | Status: DC
  Administered 2020-08-21 – 2020-08-24 (×6): 6.25 mg via ORAL
  Filled 2020-08-21: qty 1
  Filled 2020-08-21: qty 2
  Filled 2020-08-21 (×3): qty 1
  Filled 2020-08-21: qty 2

## 2020-08-21 MED ORDER — CHLORTHALIDONE 25 MG PO TABS
12.5000 mg | ORAL_TABLET | Freq: Every day | ORAL | Status: DC
  Administered 2020-08-22 – 2020-08-24 (×3): 12.5 mg via ORAL
  Filled 2020-08-21: qty 1
  Filled 2020-08-21 (×2): qty 0.5

## 2020-08-21 MED ORDER — LIDOCAINE HCL (PF) 1 % IJ SOLN
10.0000 mL | Freq: Once | INTRAMUSCULAR | Status: AC
  Administered 2020-08-21: 10 mL via INTRADERMAL
  Filled 2020-08-21: qty 30

## 2020-08-21 MED ORDER — ONDANSETRON HCL 4 MG PO TABS
4.0000 mg | ORAL_TABLET | Freq: Four times a day (QID) | ORAL | Status: DC | PRN
  Administered 2020-08-22: 4 mg via ORAL
  Filled 2020-08-21: qty 1

## 2020-08-21 MED ORDER — PANTOPRAZOLE SODIUM 40 MG PO TBEC
40.0000 mg | DELAYED_RELEASE_TABLET | Freq: Every day | ORAL | Status: DC
  Administered 2020-08-22 – 2020-08-24 (×3): 40 mg via ORAL
  Filled 2020-08-21 (×3): qty 1

## 2020-08-21 MED ORDER — POTASSIUM CHLORIDE CRYS ER 20 MEQ PO TBCR
40.0000 meq | EXTENDED_RELEASE_TABLET | Freq: Once | ORAL | Status: AC
  Administered 2020-08-21: 40 meq via ORAL
  Filled 2020-08-21: qty 2

## 2020-08-21 MED ORDER — ACETAMINOPHEN 325 MG PO TABS
650.0000 mg | ORAL_TABLET | Freq: Four times a day (QID) | ORAL | Status: DC | PRN
  Administered 2020-08-21 – 2020-08-24 (×5): 650 mg via ORAL
  Filled 2020-08-21 (×5): qty 2

## 2020-08-21 MED ORDER — ONDANSETRON HCL 4 MG/2ML IJ SOLN: 4.0000 mg | Freq: Four times a day (QID) | INTRAMUSCULAR | Status: DC | PRN

## 2020-08-21 MED ORDER — IRBESARTAN 300 MG PO TABS
300.0000 mg | ORAL_TABLET | Freq: Every day | ORAL | Status: DC
  Administered 2020-08-22 – 2020-08-24 (×3): 300 mg via ORAL
  Filled 2020-08-21 (×2): qty 1
  Filled 2020-08-21: qty 2

## 2020-08-21 MED ORDER — AMLODIPINE BESYLATE 10 MG PO TABS
10.0000 mg | ORAL_TABLET | Freq: Every day | ORAL | Status: DC
  Administered 2020-08-22 – 2020-08-24 (×3): 10 mg via ORAL
  Filled 2020-08-21 (×2): qty 1
  Filled 2020-08-21: qty 2

## 2020-08-21 MED ORDER — POTASSIUM CHLORIDE CRYS ER 20 MEQ PO TBCR
20.0000 meq | EXTENDED_RELEASE_TABLET | Freq: Two times a day (BID) | ORAL | Status: DC
  Administered 2020-08-21: 20 meq via ORAL
  Filled 2020-08-21: qty 1

## 2020-08-21 NOTE — Plan of Care (Addendum)
PHONE CALL NOTE  Called by ER APP K. Sofia regarding this patient. Recent COVID positive with home test with perplexing pattern of tingning and numbness of arms and legs followed by significant UE and LE distal weakness. Intact reflexes per EDP. CTH wnl. Mild hypokalemia, which has been treated without improvement in signs or symptoms.  In light of the history and exam provided to me over the phone, I recommend transfer to [facility] for neurological consultation. Before transfer, I have requested that an LP be performed at [facility] to evaluate for GBS. CSF to be sent for Glucose, protein, cell count, gram stain with additional fluid frozen for any additional testing to be done as deemed appropriate by the treating neurologist. Will need further neuroimaging based on exam. I will notify my team at [facility].  -- Milon Dikes, MD Neurologist [facility] Pager: [phone]

## 2020-08-21 NOTE — H&P (Signed)
History and Physical  TEMPLE SPORER YQM:[id number] DOB: 1995-12-11 DOA: 08/21/2020  Referring physician: Trisha Mangle, PA-C, EDP PCP: Selinda Flavin, MD  Outpatient Specialists: none  Patient Coming From: home  Chief Complaint: weakness  HPI: Benjamin Chang is a 25 y.o. male with a history of HTN, GERD.  Patient seen for progressive weakness over the past 2 days.  He had fever, chills that started Tuesday night.  He continued to have him on Wednesday morning and took a home COVID test which was positive.  On Thursday, he started having pain in his hands and feet, which progressed to weakness.  He has been having difficulty holding onto things and difficulty walking.  Symptoms are worsening.  No palliating or provoking factors.  Since his arrival, he did receive IV fluids, which seemed to improve the strength in his hands.   Emergency Department Course: Started on IV fluid bolus. CMP and CBC are unremarkable.  Neurology consulted, who recommended admission at [facility] and an LP prior to leaving any pen.  Review of Systems:   Pt denies any fevers, chills, nausea, vomiting, diarrhea, constipation, abdominal pain, shortness of breath, dyspnea on exertion, orthopnea, cough, wheezing, palpitations, headache, vision changes, lightheadedness, dizziness, melena, rectal bleeding.  Review of systems are otherwise negative  Past Medical History:  Diagnosis Date   Atypical chest pain    Essential hypertension    GERD (gastroesophageal reflux disease)    Past Surgical History:  Procedure Laterality Date   LAPAROSCOPIC APPENDECTOMY N/A 11/04/2015   Procedure: APPENDECTOMY LAPAROSCOPIC;  Surgeon: Ancil Linsey, MD;  Location: [facility];  Service: General;  Laterality: N/A;   TONSILLECTOMY     Social History:  reports that he has been smoking cigarettes. He has been smoking an average of .5 packs per day. He has never used smokeless tobacco. He reports that he does not drink alcohol and  does not use drugs. Patient lives at home  Allergies  Allergen Reactions   Lisinopril Swelling    Lips swell    Family History  Problem Relation Age of Onset   Heart disease Other    Heart disease Maternal Grandmother    Hypertension Maternal Grandmother    Heart attack Paternal Grandfather    Diabetes Paternal Grandfather    Hypertension Paternal Grandfather    Dementia Paternal Grandfather    Hypertension Mother    Heart disease Mother    Hypertension Father    Heart disease Father    Aneurysm Maternal Grandfather    Hypertension Paternal Grandmother       Prior to Admission medications   Medication Sig Start Date End Date Taking? Authorizing Provider  amLODipine (NORVASC) 10 MG tablet Take 1 tablet (10 mg total) by mouth daily. 04/02/20  Yes Jonelle Sidle, MD  carvedilol (COREG) 6.25 MG tablet Take 1 tablet (6.25 mg total) by mouth 2 (two) times daily. 04/02/20  Yes Jonelle Sidle, MD  cetirizine (ZYRTEC) 10 MG tablet Take 10 mg by mouth daily.   Yes [provider]  chlorthalidone (HYGROTON) 25 MG tablet TAKE 1/2 TABLET BY MOUTH DAILY (REPLACES HYDROCHLOROTHIAZIDE) Patient taking differently: Take 12.5 mg by mouth daily. TAKE 1/2 TABLET BY MOUTH DAILY (REPLACES HYDROCHLOROTHIAZIDE) 04/02/20  Yes Jonelle Sidle, MD  Multiple Vitamins-Minerals (MULTIVITAMIN WITH MINERALS) tablet Take 1 tablet by mouth daily.   Yes [provider]  omeprazole (PRILOSEC) 20 MG capsule Take 20 mg by mouth daily.   Yes [provider]  Omega-3 Fatty  Acids (OMEGA-3 2100 PO) Take by mouth.    [provider]  omeprazole (PRILOSEC) 20 MG capsule TAKE 1 CAPSULE BY MOUTH ONCE A DAY 06/19/19 06/18/20  Selinda Flavin, MD  spironolactone (ALDACTONE) 25 MG tablet Take 1 tablet (25 mg total) by mouth daily. 04/02/20   Jonelle Sidle, MD  valsartan (DIOVAN) 320 MG tablet Take 1 tablet (320 mg total) by mouth daily. 04/02/20   Jonelle Sidle, MD    Physical  Exam: BP 134/70 (BP Location: Right Arm)   Pulse 78   Temp 97.9 F (36.6 C) (Oral)   Resp 18   Ht 6\' 3"  (1.905 m)   Wt 108.9 kg   SpO2 97%   BMI 30.00 kg/m   General: Young male. Awake and alert and oriented x3. No acute cardiopulmonary distress.  HEENT: Normocephalic atraumatic.  Right and left ears normal in appearance.  Pupils equal, round, reactive to light. Extraocular muscles are intact. Sclerae anicteric and noninjected.  Moist mucosal membranes. No mucosal lesions.  Neck: Neck supple without lymphadenopathy. No carotid bruits. No masses palpated.  Cardiovascular: Regular rate with normal S1-S2 sounds. No murmurs, rubs, gallops auscultated. No JVD.  Respiratory: Good respiratory effort with no wheezes, rales, rhonchi. Lungs clear to auscultation bilaterally.  No accessory muscle use. Abdomen: Soft, nontender, nondistended. Active bowel sounds. No masses or hepatosplenomegaly  Skin: No rashes, lesions, or ulcerations.  Dry, warm to touch. 2+ dorsalis pedis and radial pulses. Musculoskeletal: No calf or leg pain. All major joints not erythematous nontender.  No upper or lower joint deformation.  Good ROM.  No contractures  Psychiatric: Intact judgment and insight. Pleasant and cooperative. Neurologic: Strength in hands and forearms 4 out of 5 bilaterally.  Slightly weaker on the left.  Lower legs are also 4 out of 5 bilaterally.  Cranial nerves II through XII are grossly intact. Sensation grossly intact.           Labs on Admission: I have personally reviewed following labs and imaging studies  CBC: Recent Labs  Lab 08/21/20 1027  WBC 6.0  NEUTROABS 3.0  HGB 16.3  HCT 43.8  MCV 89.4  PLT 162   Basic Metabolic Panel: Recent Labs  Lab 08/21/20 1027  NA 135  K 3.1*  CL 99  CO2 26  GLUCOSE 108*  BUN 12  CREATININE 0.70  CALCIUM 8.5*   GFR: Estimated Creatinine Clearance: 191.5 mL/min (by C-G formula based on SCr of 0.7 mg/dL). Liver Function Tests: Recent Labs   Lab 08/21/20 1027  AST 38  ALT 37  ALKPHOS 56  BILITOT 0.4  PROT 7.5  ALBUMIN 4.1   No results for input(s): LIPASE, AMYLASE in the last 168 hours. No results for input(s): AMMONIA in the last 168 hours. Coagulation Profile: No results for input(s): INR, PROTIME in the last 168 hours. Cardiac Enzymes: No results for input(s): CKTOTAL, CKMB, CKMBINDEX, TROPONINI in the last 168 hours. BNP (last 3 results) No results for input(s): PROBNP in the last 8760 hours. HbA1C: No results for input(s): HGBA1C in the last 72 hours. CBG: No results for input(s): GLUCAP in the last 168 hours. Lipid Profile: No results for input(s): CHOL, HDL, LDLCALC, TRIG, CHOLHDL, LDLDIRECT in the last 72 hours. Thyroid Function Tests: No results for input(s): TSH, T4TOTAL, FREET4, T3FREE, THYROIDAB in the last 72 hours. Anemia Panel: No results for input(s): VITAMINB12, FOLATE, FERRITIN, TIBC, IRON, RETICCTPCT in the last 72 hours. Urine analysis:    Component Value Date/Time  COLORURINE YELLOW 11/04/2015 1639   APPEARANCEUR CLEAR 11/04/2015 1639   LABSPEC 1.020 11/04/2015 1639   PHURINE 6.0 11/04/2015 1639   GLUCOSEU NEGATIVE 11/04/2015 1639   HGBUR NEGATIVE 11/04/2015 1639   BILIRUBINUR NEGATIVE 11/04/2015 1639   KETONESUR NEGATIVE 11/04/2015 1639   PROTEINUR NEGATIVE 11/04/2015 1639   NITRITE NEGATIVE 11/04/2015 1639   LEUKOCYTESUR NEGATIVE 11/04/2015 1639   Sepsis Labs: @LABRCNTIP (procalcitonin:4,lacticidven:4) )No results found for this or any previous visit (from the past 240 hour(s)).   Radiological Exams on Admission: CT Head Wo Contrast  Result Date: 08/21/2020 CLINICAL DATA:  Numbness or tingling. Paresthesias. Patient tested positive for COVID-19 three days ago. EXAM: CT HEAD WITHOUT CONTRAST TECHNIQUE: Contiguous axial images were obtained from the base of the skull through the vertex without intravenous contrast. COMPARISON:  March 01, 2015 FINDINGS: Brain: No evidence of acute  infarction, hemorrhage, hydrocephalus, extra-axial collection or mass lesion/mass effect. Vascular: No hyperdense vessel or unexpected calcification. Skull: Normal. Negative for fracture or focal lesion. Sinuses/Orbits: No acute finding. Other: None. IMPRESSION: Normal brain.  No cause for symptoms identified. Electronically Signed   By: March 03, 2015 III M.D   On: 08/21/2020 12:20     Assessment/Plan: Principal Problem:   Generalized weakness Active Problems:   Essential hypertension   COVID-19 virus infection    This patient was discussed with the ED physician, including pertinent vitals, physical exam findings, labs, and imaging.  We also discussed care given by the ED provider.  Generalized weakness Admit to [facility]. Neurology to see him Check TSH LP done - results pending COVID-19 infection Currently asymptomatic Hypertension Continue antihypertensives   DVT prophylaxis: SCDs as recent LP.  Consultants: neuor Code Status: Full Family Communication: none  Disposition Plan: pending   [facility], DO

## 2020-08-21 NOTE — ED Notes (Signed)
Patient transported to CT 

## 2020-08-21 NOTE — ED Provider Notes (Signed)
.  Lumbar Puncture  Date/Time: 08/21/2020 4:18 PM Performed by: Pricilla Loveless, MD Authorized by: Pricilla Loveless, MD   Consent:    Consent obtained:  Verbal and written   Consent given by:  Patient   Risks, benefits, and alternatives were discussed: yes     Risks discussed:  Bleeding, headache, nerve damage, infection, pain and repeat procedure Universal protocol:    Patient identity confirmed:  Verbally with patient Pre-procedure details:    Procedure purpose:  Diagnostic   Preparation: Patient was prepped and draped in usual sterile fashion   Anesthesia:    Anesthesia method:  Local infiltration   Local anesthetic:  Lidocaine 1% w/o epi Procedure details:    Lumbar space:  L4-L5 interspace   Patient position:  Sitting   Needle gauge:  22   Needle length (in):  3.5   Number of attempts:  3   Fluid appearance:  Blood-tinged then clearing   Tubes of fluid:  4   Total volume (ml):  8 Post-procedure details:    Puncture site:  Adhesive bandage applied   Procedure completion:  Tolerated with difficulty    Pricilla Loveless, MD 08/21/20 1619

## 2020-08-21 NOTE — ED Provider Notes (Signed)
[facility] EMERGENCY DEPARTMENT Provider Note   CSN: [id number] Arrival date & time: 08/21/20  6967     History Chief Complaint  Patient presents with  . Weakness    Benjamin Chang is a 25 y.o. male.  Pt complains of his legs and hands being weak.  Pt began feeling sick on Tuesday.  Pt reports he tested positive for covid on Wednesday.  Pt reports yesterday his feet and legs began feeling weak.  Pt reports weakness in both hands today.    The history is provided by the patient. No language interpreter was used.  Weakness Severity:  Moderate Onset quality:  Gradual Timing:  Constant Progression:  Worsening Chronicity:  New Context: recent infection   Relieved by:  Nothing Worsened by:  Nothing Ineffective treatments:  None tried Associated symptoms: no dizziness   Risk factors: no new medications and no recent stressors       Past Medical History:  Diagnosis Date  . Atypical chest pain   . Essential hypertension   . GERD (gastroesophageal reflux disease)     Patient Active Problem List   Diagnosis Date Noted  . Essential hypertension 06/21/2019  . GERD (gastroesophageal reflux disease) 06/21/2019  . Atypical chest pain 06/21/2019  . Acute appendicitis with localized peritonitis 11/04/2015    Past Surgical History:  Procedure Laterality Date  . LAPAROSCOPIC APPENDECTOMY N/A 11/04/2015   Procedure: APPENDECTOMY LAPAROSCOPIC;  Surgeon: Ancil Linsey, MD;  Location: [facility];  Service: General;  Laterality: N/A;  . TONSILLECTOMY         Family History  Problem Relation Age of Onset  . Heart disease Other   . Heart disease Maternal Grandmother   . Hypertension Maternal Grandmother   . Heart attack Paternal Grandfather   . Diabetes Paternal Grandfather   . Hypertension Paternal Grandfather   . Dementia Paternal Grandfather   . Hypertension Mother   . Heart disease Mother   . Hypertension Father   . Heart disease Father   . Aneurysm Maternal  Grandfather   . Hypertension Paternal Grandmother     Social History   Tobacco Use  . Smoking status: Some Days    Packs/day: 0.50    Types: Cigarettes  . Smokeless tobacco: Never  Vaping Use  . Vaping Use: Never used  Substance Use Topics  . Alcohol use: No  . Drug use: No    Home Medications Prior to Admission medications   Medication Sig Start Date End Date Taking? Authorizing Provider  amLODipine (NORVASC) 10 MG tablet Take 1 tablet (10 mg total) by mouth daily. 04/02/20   Jonelle Sidle, MD  carvedilol (COREG) 6.25 MG tablet Take 1 tablet (6.25 mg total) by mouth 2 (two) times daily. 04/02/20   Jonelle Sidle, MD  cetirizine (ZYRTEC) 10 MG tablet Take 10 mg by mouth daily.    [provider]  chlorthalidone (HYGROTON) 25 MG tablet TAKE 1/2 TABLET BY MOUTH DAILY (REPLACES HYDROCHLOROTHIAZIDE) 04/02/20   Jonelle Sidle, MD  Multiple Vitamins-Minerals (MULTIVITAMIN WITH MINERALS) tablet Take 1 tablet by mouth daily.    [provider]  Omega-3 Fatty Acids (OMEGA-3 2100 PO) Take by mouth.    [provider]  omeprazole (PRILOSEC) 20 MG capsule Take 20 mg by mouth daily.    [provider]  omeprazole (PRILOSEC) 20 MG capsule TAKE 1 CAPSULE BY MOUTH ONCE A DAY 06/19/19 06/18/20  Selinda Flavin, MD  spironolactone (ALDACTONE) 25 MG tablet Take 1 tablet (25 mg  total) by mouth daily. 04/02/20   Jonelle Sidle, MD  valsartan (DIOVAN) 320 MG tablet Take 1 tablet (320 mg total) by mouth daily. 04/02/20   Jonelle Sidle, MD    Allergies    Lisinopril  Review of Systems   Review of Systems  Neurological:  Positive for weakness. Negative for dizziness.  All other systems reviewed and are negative.  Physical Exam Updated Vital Signs BP (!) 142/87 (BP Location: Right Arm)   Pulse 79   Temp 97.9 F (36.6 C) (Oral)   Resp 18   Ht 6\' 3"  (1.905 m)   Wt 108.9 kg   BMI 30.00 kg/m   Physical Exam Vitals and nursing note reviewed.   Constitutional:      Appearance: He is well-developed.  HENT:     Head: Normocephalic.     Nose: Nose normal.     Mouth/Throat:     Mouth: Mucous membranes are moist.  Eyes:     Pupils: Pupils are equal, round, and reactive to light.  Cardiovascular:     Rate and Rhythm: Normal rate.  Pulmonary:     Effort: Pulmonary effort is normal.  Abdominal:     General: Abdomen is flat. There is no distension.  Musculoskeletal:        General: Normal range of motion.     Cervical back: Normal range of motion.  Skin:    General: Skin is warm.  Neurological:     General: No focal deficit present.     Mental Status: He is alert and oriented to person, place, and time.     Comments: Decreased grip,  weak gait,    Psychiatric:        Mood and Affect: Mood normal.    ED Results / Procedures / Treatments   Labs (all labs ordered are listed, but only abnormal results are displayed) Labs Reviewed  CBC WITH DIFFERENTIAL/PLATELET - Abnormal; Notable for the following components:      Result Value   MCHC 37.2 (*)    All other components within normal limits  COMPREHENSIVE METABOLIC PANEL - Abnormal; Notable for the following components:   Potassium 3.1 (*)    Glucose, Bld 108 (*)    Calcium 8.5 (*)    All other components within normal limits    EKG None  Radiology CT Head Wo Contrast  Result Date: 08/21/2020 CLINICAL DATA:  Numbness or tingling. Paresthesias. Patient tested positive for COVID-19 three days ago. EXAM: CT HEAD WITHOUT CONTRAST TECHNIQUE: Contiguous axial images were obtained from the base of the skull through the vertex without intravenous contrast. COMPARISON:  March 01, 2015 FINDINGS: Brain: No evidence of acute infarction, hemorrhage, hydrocephalus, extra-axial collection or mass lesion/mass effect. Vascular: No hyperdense vessel or unexpected calcification. Skull: Normal. Negative for fracture or focal lesion. Sinuses/Orbits: No acute finding. Other: None.  IMPRESSION: Normal brain.  No cause for symptoms identified. Electronically Signed   By: March 03, 2015 III M.D   On: 08/21/2020 12:20    Procedures Procedures   Medications Ordered in ED Medications  sodium chloride 0.9 % bolus 1,000 mL (1,000 mLs Intravenous New Bag/Given 08/21/20 1133)  potassium chloride SA (KLOR-CON) CR tablet 40 mEq (40 mEq Oral Given 08/21/20 1127)    ED Course  I have reviewed the triage vital signs and the nursing notes.  Pertinent labs & imaging results that were available during my care of the patient were reviewed by me and considered in my medical decision making (see  chart for details).  Clinical Course as of 08/21/20 1814  Sat Aug 21, 2020  1813 CSF cell count with differential collection tube #: 1 (hold extra CSF please)(!) [LS]    Clinical Course User Index [LS] Elson Areas, PA-C   MDM Rules/Calculators/A&P                          MDM:  Ct scan normal.  I discussed with Dr. Wilford Corner.  He advised LP, admit to hospitalist and [facility] and to see neurology  Lp here by Dr. Criss Alvine.   Hospitalist will admit   Final Clinical Impression(s) / ED Diagnoses Final diagnoses:  Weakness  Hypokalemia    Rx / DC Orders ED Discharge Orders     None        Osie Cheeks 08/21/20 1755    Bethann Berkshire, MD 08/23/20 [phone]

## 2020-08-21 NOTE — ED Triage Notes (Addendum)
Pt reports testing positive for covid on Wednesday, started feeling worse today with cough, generalized weakness, tingling to bilateral hands and feet. Pain in both legs when walking.

## 2020-08-21 NOTE — ED Triage Notes (Signed)
Pt states that he tested positive for covid on Wednesday and starting Friday he started having trouble walking and using his hands, he states that it is just getting worse.

## 2020-08-22 DIAGNOSIS — Z79899 Other long term (current) drug therapy: Secondary | ICD-10-CM | POA: Diagnosis not present

## 2020-08-22 DIAGNOSIS — U071 COVID-19: Secondary | ICD-10-CM | POA: Diagnosis not present

## 2020-08-22 DIAGNOSIS — I10 Essential (primary) hypertension: Secondary | ICD-10-CM

## 2020-08-22 DIAGNOSIS — R29898 Other symptoms and signs involving the musculoskeletal system: Secondary | ICD-10-CM

## 2020-08-22 DIAGNOSIS — E876 Hypokalemia: Secondary | ICD-10-CM | POA: Insufficient documentation

## 2020-08-22 DIAGNOSIS — R209 Unspecified disturbances of skin sensation: Secondary | ICD-10-CM | POA: Diagnosis not present

## 2020-08-22 DIAGNOSIS — F17210 Nicotine dependence, cigarettes, uncomplicated: Secondary | ICD-10-CM | POA: Diagnosis not present

## 2020-08-22 DIAGNOSIS — R531 Weakness: Secondary | ICD-10-CM | POA: Diagnosis present

## 2020-08-22 LAB — RAPID URINE DRUG SCREEN, HOSP PERFORMED
Amphetamines: NOT DETECTED
Barbiturates: NOT DETECTED
Benzodiazepines: NOT DETECTED
Cocaine: NOT DETECTED
Opiates: NOT DETECTED
Tetrahydrocannabinol: NOT DETECTED

## 2020-08-22 LAB — CBC
HCT: 38.8 % — ABNORMAL LOW (ref 39.0–52.0)
Hemoglobin: 14.3 g/dL (ref 13.0–17.0)
MCH: 33.5 pg (ref 26.0–34.0)
MCHC: 36.9 g/dL — ABNORMAL HIGH (ref 30.0–36.0)
MCV: 90.9 fL (ref 80.0–100.0)
Platelets: 152 10*3/uL (ref 150–400)
RBC: 4.27 MIL/uL (ref 4.22–5.81)
RDW: 12.1 % (ref 11.5–15.5)
WBC: 4 10*3/uL (ref 4.0–10.5)
nRBC: 0 % (ref 0.0–0.2)

## 2020-08-22 LAB — URINALYSIS, COMPLETE (UACMP) WITH MICROSCOPIC
Bacteria, UA: NONE SEEN
Bilirubin Urine: NEGATIVE
Glucose, UA: NEGATIVE mg/dL
Hgb urine dipstick: NEGATIVE
Ketones, ur: NEGATIVE mg/dL
Leukocytes,Ua: NEGATIVE
Nitrite: NEGATIVE
Protein, ur: NEGATIVE mg/dL
Specific Gravity, Urine: 1.01 (ref 1.005–1.030)
pH: 7 (ref 5.0–8.0)

## 2020-08-22 LAB — BASIC METABOLIC PANEL
Anion gap: 7 (ref 5–15)
BUN: 11 mg/dL (ref 6–20)
CO2: 28 mmol/L (ref 22–32)
Calcium: 8.7 mg/dL — ABNORMAL LOW (ref 8.9–10.3)
Chloride: 103 mmol/L (ref 98–111)
Creatinine, Ser: 0.71 mg/dL (ref 0.61–1.24)
GFR, Estimated: >60 mL/min (ref >60)
Glucose, Bld: 111 mg/dL — ABNORMAL HIGH (ref 70–99)
Potassium: 3.6 mmol/L (ref 3.5–5.1)
Sodium: 138 mmol/L (ref 135–145)

## 2020-08-22 LAB — VITAMIN B12: Vitamin B-12: 332 pg/mL (ref 180–914)

## 2020-08-22 LAB — FERRITIN: Ferritin: 725 ng/mL — ABNORMAL HIGH (ref 24–336)

## 2020-08-22 LAB — MAGNESIUM: Magnesium: 2 mg/dL (ref 1.7–2.4)

## 2020-08-22 LAB — D-DIMER, QUANTITATIVE: D-Dimer, Quant: 1.13 ug/mL-FEU — ABNORMAL HIGH (ref 0.00–0.50)

## 2020-08-22 LAB — FOLATE: Folate: 12.2 ng/mL (ref >5.9)

## 2020-08-22 LAB — HIV ANTIBODY (ROUTINE TESTING W REFLEX): HIV Screen 4th Generation wRfx: NONREACTIVE

## 2020-08-22 LAB — CK: Total CK: 881 U/L — ABNORMAL HIGH (ref 49–397)

## 2020-08-22 MED ORDER — POTASSIUM CHLORIDE CRYS ER 20 MEQ PO TBCR
10.0000 meq | EXTENDED_RELEASE_TABLET | Freq: Once | ORAL | Status: AC
  Administered 2020-08-22: 10 meq via ORAL
  Filled 2020-08-22: qty 1

## 2020-08-22 MED ORDER — POTASSIUM CHLORIDE IN NACL 20-0.9 MEQ/L-% IV SOLN
INTRAVENOUS | Status: AC
  Filled 2020-08-22 (×2): qty 1000

## 2020-08-22 NOTE — Progress Notes (Signed)
PROGRESS NOTE  Benjamin MCCLANAHAN DTO:[id number] DOB: October 28, 1995 DOA: 08/21/2020 PCP: Selinda Flavin, MD  Brief History:  25 year old male with a history of hypertension and GERD presenting with 3 to 4-day history of arm and leg weakness that was preceded by numbness and tingling and pain.  Patient states that he began having fevers and chills on the evening of 08/17/2020.  He took a home COVID test that was positive on 08/18/2020.  On 08/18/2020, he began having tingling and pain that was a sending from his feet and eventually progressing up to his arms by 08/19/2020.  Starting on 08/20/2020, he began having some leg weakness and difficulty walking.  He did not have a fall, and he was able to ambulate although he needed to hold onto the wall.  His weakness continued to migrate up to his arms and hands to the point where he was having difficulty holding onto objects.  He denied any dysarthria, visual disturbance, shortness of breath, facial droop.  Denies neck or back pain.  He did have a headache on 08/17/2020 and 08/18/2020, but this has subsided.  Because of his progressive weakness, he presented for further evaluation.  The patient denies any illicit drug use or alcohol use.  He continues to smoke cigarettes.  He smokes approximately 1/2 pack/day for the last 30 years.  He denies any new medications.  He denies taking any over-the-counter supplements or medications aside from acetaminophen and ibuprofen. In the emergency department, the patient was afebrile hemodynamically stable with oxygen saturation 90-99% on room air.  BMP showed a sodium 135, potassium 3.1, serum creatinine 0.71.  LFTs were unremarkable.  WBC 6.0, hemoglobin 16.3, platelets 162,000.  Neurology was consulted and recommended lumbar puncture.  CSF showed a 0 WBC, protein 21, glucose 61.  The patient was started on IV fluids and potassium replacement.  He did experience improvement in his sensory disturbance as well as weakness in his  arms and legs, but he states that he is not back to "normal".  Neurology recommended transfer to [facility] for further evaluation and treatment.  CT of the brain was negative.  Assessment/Plan: Leg weakness/arm weakness/sensory disturbance -Patient has weak reflexes present on exam -CSF does not show classic albuminocytologic dissassociation -Serum B12 -CPK -Folic acid -UDS -Thiamine level -TSH 2.963 -Check magnesium  COVID-19 infection -Stable on room air -Check inflammatory markers  Hypokalemia -Repleted -check mag  Essential hypertension -Continue home doses of amlodipine, carvedilol, chlorthalidone, irbesartan, spironolactone      Status is: Inpatient  Remains inpatient appropriate because:Ongoing diagnostic testing needed not appropriate for outpatient work up  Dispo: The patient is from: Home              Anticipated d/c is to: Home              Patient currently is not medically stable to d/c.   Difficult to place patient No        Family Communication:   no Family at bedside  Consultants:  neurology  Code Status:  FULL   DVT Prophylaxis:  SCDs   Procedures: As Listed in Progress Note Above  Antibiotics: None        Subjective: Patient states that he has been a little bit stronger in his arms and legs but is not back to normal.  He is still feeling some numbness and tingling in his arms along but is improving. Patient denies fevers, chills, headache, chest  pain, dyspnea, nausea, vomiting, diarrhea, abdominal pain, dysuria, hematuria, hematochezia, and melena.   Objective: Vitals:   08/21/20 1530 08/21/20 1830 08/21/20 2222 08/22/20 0025  BP: 134/70 140/76 (!) 137/91 138/86  Pulse: 78 74 81 78  Resp: 18 18 18 17   Temp:   98.2 F (36.8 C)   TempSrc:      SpO2: 97% 99% 97% 99%  Weight:      Height:       No intake or output data in the 24 hours ending 08/22/20 0714 Weight change:  Exam:  General:  Pt is alert, follows commands  appropriately, not in acute distress HEENT: No icterus, No thrush, No neck mass, [place]/AT Cardiovascular: RRR, S1/S2, no rubs, no gallops Respiratory: CTA bilaterally, no wheezing, no crackles, no rhonchi Abdomen: Soft/+BS, non tender, non distended, no guarding Extremities: No edema, No lymphangitis, No petechiae, No rashes, no synovitis Neuro:  CN II-XII intact, strength 4/5 in RUE, RLE, strength 4/5 LUE, LLE; sensation intact bilateral; no dysmetria; babinski equivocal;  achilles and patellar reflex 1/4    Data Reviewed: I have personally reviewed following labs and imaging studies Basic Metabolic Panel: Recent Labs  Lab 08/21/20 1027 08/22/20 0435  NA 135 138  K 3.1* 3.6  CL 99 103  CO2 26 28  GLUCOSE 108* 111*  BUN 12 11  CREATININE 0.70 0.71  CALCIUM 8.5* 8.7*   Liver Function Tests: Recent Labs  Lab 08/21/20 1027  AST 38  ALT 37  ALKPHOS 56  BILITOT 0.4  PROT 7.5  ALBUMIN 4.1   No results for input(s): LIPASE, AMYLASE in the last 168 hours. No results for input(s): AMMONIA in the last 168 hours. Coagulation Profile: No results for input(s): INR, PROTIME in the last 168 hours. CBC: Recent Labs  Lab 08/21/20 1027 08/22/20 0435  WBC 6.0 4.0  NEUTROABS 3.0  --   HGB 16.3 14.3  HCT 43.8 38.8*  MCV 89.4 90.9  PLT 162 152   Cardiac Enzymes: No results for input(s): CKTOTAL, CKMB, CKMBINDEX, TROPONINI in the last 168 hours. BNP: Invalid input(s): POCBNP CBG: No results for input(s): GLUCAP in the last 168 hours. HbA1C: No results for input(s): HGBA1C in the last 72 hours. Urine analysis:    Component Value Date/Time   COLORURINE YELLOW 11/04/2015 1639   APPEARANCEUR CLEAR 11/04/2015 1639   LABSPEC 1.020 11/04/2015 1639   PHURINE 6.0 11/04/2015 1639   GLUCOSEU NEGATIVE 11/04/2015 1639   HGBUR NEGATIVE 11/04/2015 1639   BILIRUBINUR NEGATIVE 11/04/2015 1639   KETONESUR NEGATIVE 11/04/2015 1639   PROTEINUR NEGATIVE 11/04/2015 1639   NITRITE NEGATIVE  11/04/2015 1639   LEUKOCYTESUR NEGATIVE 11/04/2015 1639   Sepsis Labs: @LABRCNTIP (procalcitonin:4,lacticidven:4) ) Recent Results (from the past 240 hour(s))  Gram stain     Status: None   Collection Time: 08/21/20  3:20 PM   Specimen: CSF; Cerebrospinal Fluid  Result Value Ref Range Status   Specimen Description CSF  Final   Special Requests NONE  Final   Gram Stain   Final    NO ORGANISMS SEEN RARE WBC SEEN CRITICAL RESULT CALLED TO, READ BACK BY AND VERIFIED WITH: TURNER,C 1924 08/21/2020 COLEMAN,R Performed at [facility], [address] [place], [address] [place]    Report Status 08/21/2020 FINAL  Final  Resp Panel by RT-PCR (Flu A&B, Covid) Nasopharyngeal Swab     Status: Abnormal   Collection Time: 08/21/20  5:05 PM   Specimen: Nasopharyngeal Swab; Nasopharyngeal(NP) swabs in vial transport medium  Result Value  Ref Range Status   SARS Coronavirus 2 by RT PCR POSITIVE (A) NEGATIVE Final    Comment: RESULT CALLED TO, READ BACK BY AND VERIFIED WITH: MARTIN,D @ 1800 ON 08/21/20 BY JUW (NOTE) SARS-CoV-2 target nucleic acids are DETECTED.  The SARS-CoV-2 RNA is generally detectable in upper respiratory specimens during the acute phase of infection. Positive results are indicative of the presence of the identified virus, but do not rule out bacterial infection or co-infection with other pathogens not detected by the test. Clinical correlation with patient history and other diagnostic information is necessary to determine patient infection status. The expected result is Negative.  Fact Sheet for Patients: BloggerCourse.com  Fact Sheet for Healthcare Providers: SeriousBroker.it  This test is not yet approved or cleared by the [place] FDA and  has been authorized for detection and/or diagnosis of SARS-CoV-2 by FDA under an Emergency Use Authorization (EUA).  This EUA will remain in effect (meaning this test can be   used) for the duration of  the COVID-19 declaration under Section 564(b)(1) of the Act, 21 U.S.C. section 360bbb-3(b)(1), unless the authorization is terminated or revoked sooner.     Influenza A by PCR NEGATIVE NEGATIVE Final   Influenza B by PCR NEGATIVE NEGATIVE Final    Comment: (NOTE) The Xpert Xpress SARS-CoV-2/FLU/RSV plus assay is intended as an aid in the diagnosis of influenza from Nasopharyngeal swab specimens and should not be used as a sole basis for treatment. Nasal washings and aspirates are unacceptable for Xpert Xpress SARS-CoV-2/FLU/RSV testing.  Fact Sheet for Patients: BloggerCourse.com  Fact Sheet for Healthcare Providers: SeriousBroker.it  This test is not yet approved or cleared by the [place] FDA and has been authorized for detection and/or diagnosis of SARS-CoV-2 by FDA under an Emergency Use Authorization (EUA). This EUA will remain in effect (meaning this test can be used) for the duration of the COVID-19 declaration under Section 564(b)(1) of the Act, 21 U.S.C. section 360bbb-3(b)(1), unless the authorization is terminated or revoked.  Performed at [facility], [address] [place], [place] [address]      Scheduled Meds:  amLODipine  10 mg Oral Daily   carvedilol  6.25 mg Oral BID   chlorthalidone  12.5 mg Oral Daily   irbesartan  300 mg Oral Daily   loratadine  10 mg Oral Daily   pantoprazole  40 mg Oral Daily   potassium chloride  20 mEq Oral BID   spironolactone  25 mg Oral Daily   Continuous Infusions:  sodium chloride 100 mL/hr at 08/21/20 2218    Procedures/Studies: CT Head Wo Contrast  Result Date: 08/21/2020 CLINICAL DATA:  Numbness or tingling. Paresthesias. Patient tested positive for COVID-19 three days ago. EXAM: CT HEAD WITHOUT CONTRAST TECHNIQUE: Contiguous axial images were obtained from the base of the skull through the vertex without intravenous contrast.  COMPARISON:  March 01, 2015 FINDINGS: Brain: No evidence of acute infarction, hemorrhage, hydrocephalus, extra-axial collection or mass lesion/mass effect. Vascular: No hyperdense vessel or unexpected calcification. Skull: Normal. Negative for fracture or focal lesion. Sinuses/Orbits: No acute finding. Other: None. IMPRESSION: Normal brain.  No cause for symptoms identified. Electronically Signed   By: Gerome Sam III M.D   On: 08/21/2020 12:20    Catarina Hartshorn, DO  [facility]  If 7PM-7AM, please contact night-coverage [email or web address] Password [email or web address] 08/22/2020, 7:14 AM   LOS: 1 day

## 2020-08-22 NOTE — ED Notes (Signed)
ED TO INPATIENT HANDOFF REPORT  ED Nurse Name and Phone #:   S Name/Age/Gender Benjamin Chang 25 y.o. male Room/Bed: APA07/APA07  Code Status   Code Status: Full Code  Home/SNF/Other Home Patient oriented to: self, place, time and situation Is this baseline? Yes   Triage Complete: Triage complete  Chief Complaint Generalized weakness [R53.1]  Triage Note Pt states that he tested positive for covid on Wednesday and starting Friday he started having trouble walking and using his hands, he states that it is just getting worse.    Allergies Allergies  Allergen Reactions  . Lisinopril Swelling    Lips swell    Level of Care/Admitting Diagnosis ED Disposition    ED Disposition  Admit   Condition  --   Comment  Hospital Area: [facility] [[id number]]  Level of Care: Med-Surg [[id number]]  May admit patient to [facility] or [facility] if equivalent level of care is available:: No  Covid Evaluation: Confirmed COVID Positive  Diagnosis: Generalized weakness [086578]  Admitting Physician: Levie Heritage [[id number]]  Attending Physician: Levie Heritage [[id number]]  Estimated length of stay: past midnight tomorrow  Certification:: I certify this patient will need inpatient services for at least 2 midnights         B Medical/Surgery History Past Medical History:  Diagnosis Date  . Atypical chest pain   . Essential hypertension   . GERD (gastroesophageal reflux disease)    Past Surgical History:  Procedure Laterality Date  . LAPAROSCOPIC APPENDECTOMY N/A 11/04/2015   Procedure: APPENDECTOMY LAPAROSCOPIC;  Surgeon: Ancil Linsey, MD;  Location: [facility];  Service: General;  Laterality: N/A;  . TONSILLECTOMY       A IV Location/Drains/Wounds Patient Lines/Drains/Airways Status    Active Line/Drains/Airways    Name Placement date Placement time Site Days   Peripheral IV 08/21/20 20 G Right Antecubital 08/21/20  1129  Antecubital  1           Intake/Output Last 24 hours  Intake/Output Summary (Last 24 hours) at 08/22/2020 2002 Last data filed at 08/22/2020 1900 Gross per 24 hour  Intake 1000 ml  Output 2500 ml  Net -1500 ml    Labs/Imaging Results for orders placed or performed during the hospital encounter of 08/21/20 (from the past 48 hour(s))  CBC with Differential/Platelet     Status: Abnormal   Collection Time: 08/21/20 10:27 AM  Result Value Ref Range   WBC 6.0 4.0 - 10.5 K/uL   RBC 4.90 4.22 - 5.81 MIL/uL   Hemoglobin 16.3 13.0 - 17.0 g/dL   HCT 46.9 62.9 - 52.8 %   MCV 89.4 80.0 - 100.0 fL   MCH 33.3 26.0 - 34.0 pg   MCHC 37.2 (H) 30.0 - 36.0 g/dL   RDW 41.3 24.4 - 01.0 %   Platelets 162 150 - 400 K/uL   nRBC 0.0 0.0 - 0.2 %   Neutrophils Relative % 49 %   Neutro Abs 3.0 1.7 - 7.7 K/uL   Lymphocytes Relative 39 %   Lymphs Abs 2.3 0.7 - 4.0 K/uL   Monocytes Relative 11 %   Monocytes Absolute 0.6 0.1 - 1.0 K/uL   Eosinophils Relative 1 %   Eosinophils Absolute 0.0 0.0 - 0.5 K/uL   Basophils Relative 0 %   Basophils Absolute 0.0 0.0 - 0.1 K/uL   Immature Granulocytes 0 %   Abs Immature Granulocytes 0.01 0.00 - 0.07 K/uL    Comment: Performed at [facility], [address] [place], [place] [address]  Comprehensive metabolic panel     Status: Abnormal   Collection Time: 08/21/20 10:27 AM  Result Value Ref Range   Sodium 135 135 - 145 mmol/L   Potassium 3.1 (L) 3.5 - 5.1 mmol/L   Chloride 99 98 - 111 mmol/L   CO2 26 22 - 32 mmol/L   Glucose, Bld 108 (H) 70 - 99 mg/dL    Comment: Glucose reference range applies only to samples taken after fasting for at least 8 hours.   BUN 12 6 - 20 mg/dL   Creatinine, Ser 6.040.70 0.61 - 1.24 mg/dL   Calcium 8.5 (L) 8.9 - 10.3 mg/dL   Total Protein 7.5 6.5 - 8.1 g/dL   Albumin 4.1 3.5 - 5.0 g/dL   AST 38 15 - 41 U/L   ALT 37 0 - 44 U/L   Alkaline Phosphatase 56 38 - 126 U/L   Total Bilirubin 0.4 0.3 - 1.2 mg/dL   GFR, Estimated >54>60 >09>60 mL/min    Comment:  (NOTE) Calculated using the CKD-EPI Creatinine Equation (2021)    Anion gap 10 5 - 15    Comment: Performed at [facility], [address] [place], [place] [address]  TSH     Status: None   Collection Time: 08/21/20 10:27 AM  Result Value Ref Range   TSH 2.963 0.350 - 4.500 uIU/mL    Comment: Performed by a 3rd Generation assay with a functional sensitivity of <=0.01 uIU/mL. Performed at [facility], [address] [place], [place] [address]   CSF cell count with differential collection tube #: 4     Status: Abnormal   Collection Time: 08/21/20  3:20 PM  Result Value Ref Range   Tube # 4    Color, CSF CLEAR (A) COLORLESS   Appearance, CSF COLORLESS (A) CLEAR   Supernatant CLEAR AND COLORLESS    RBC Count, CSF 38 (H) 0 /cu mm   WBC, CSF 0 0 - 5 /cu mm   Other Cells, CSF TOO FEW TO COUNT, SMEAR AVAILABLE FOR REVIEW     Comment: Performed at [facility], [address] [place], [place] [address]  CSF culture     Status: None (Preliminary result)   Collection Time: 08/21/20  3:20 PM   Specimen: Back; Cerebrospinal Fluid  Result Value Ref Range   Specimen Description      BACK Performed at [facility], [address] [place], [place] [address]    Special Requests      NONE Performed at [facility], [address] [place], [place] [address]    Gram Stain      WBC PRESENT,BOTH PMN AND MONONUCLEAR NO ORGANISMS SEEN CYTOSPIN SMEAR    Culture      NO GROWTH < 24 HOURS Performed at [facility], [address] [place], [place] [address]    Report Status PENDING   Gram stain     Status: None   Collection Time: 08/21/20  3:20 PM   Specimen: CSF; Cerebrospinal Fluid  Result Value Ref Range   Specimen Description CSF    Special Requests NONE    Gram Stain      NO ORGANISMS SEEN RARE WBC SEEN CRITICAL RESULT CALLED TO, READ BACK BY AND VERIFIED WITH: TURNER,C 1924 08/21/2020 COLEMAN,R Performed at [facility], [address] [place], [place] [address]    Report  Status 08/21/2020 FINAL   Glucose, CSF     Status: None  Collection Time: 08/21/20  3:20 PM  Result Value Ref Range   Glucose, CSF 61 40 - 70 mg/dL    Comment: Performed at [facility], [address] [place], [place] [address]  Protein, CSF     Status: None   Collection Time: 08/21/20  3:20 PM  Result Value Ref Range   Total  Protein, CSF 21 15 - 45 mg/dL    Comment: Performed at [facility], [address], [place], [place] [address]  CSF cell count with differential collection tube #: 1 (hold extra CSF please)     Status: Abnormal   Collection Time: 08/21/20  4:18 PM  Result Value Ref Range   Tube # 1    Color, CSF BLOODY (A) COLORLESS   Appearance, CSF CLEAR CLEAR   Supernatant CLEAR AND COLORLESS    RBC Count, CSF 157 (H) 0 /cu mm   WBC, CSF 0 0 - 5 /cu mm   Other Cells, CSF TOO FEW TO COUNT, SMEAR AVAILABLE FOR REVIEW     Comment: Performed at [facility], [address], [place], [place] [address]  Resp Panel by RT-PCR (Flu A&B, Covid) Nasopharyngeal Swab     Status: Abnormal   Collection Time: 08/21/20  5:05 PM   Specimen: Nasopharyngeal Swab; Nasopharyngeal(NP) swabs in vial transport medium  Result Value Ref Range   SARS Coronavirus 2 by RT PCR POSITIVE (A) NEGATIVE    Comment: RESULT CALLED TO, READ BACK BY AND VERIFIED WITH: MARTIN,D @ 1800 ON 08/21/20 BY JUW (NOTE) SARS-CoV-2 target nucleic acids are DETECTED.  The SARS-CoV-2 RNA is generally detectable in upper respiratory specimens during the acute phase of infection. Positive results are indicative of the presence of the identified virus, but do not rule out bacterial infection or co-infection with other pathogens not detected by the test. Clinical correlation with patient history and other diagnostic information is necessary to determine patient infection status. The expected result is Negative.  Fact Sheet for Patients: BloggerCourse.com  Fact Sheet for Healthcare  Providers: SeriousBroker.it  This test is not yet approved or cleared by the [place] FDA and  has been authorized for detection and/or diagnosis of SARS-CoV-2 by FDA under an Emergency Use Authorization (EUA).  This EUA will remain in effect (meaning this test can be  used) for the duration of  the COVID-19 declaration under Section 564(b)(1) of the Act, 21 U.S.C. section 360bbb-3(b)(1), unless the authorization is terminated or revoked sooner.     Influenza A by PCR NEGATIVE NEGATIVE   Influenza B by PCR NEGATIVE NEGATIVE    Comment: (NOTE) The Xpert Xpress SARS-CoV-2/FLU/RSV plus assay is intended as an aid in the diagnosis of influenza from Nasopharyngeal swab specimens and should not be used as a sole basis for treatment. Nasal washings and aspirates are unacceptable for Xpert Xpress SARS-CoV-2/FLU/RSV testing.  Fact Sheet for Patients: BloggerCourse.com  Fact Sheet for Healthcare Providers: SeriousBroker.it  This test is not yet approved or cleared by the [place] FDA and has been authorized for detection and/or diagnosis of SARS-CoV-2 by FDA under an Emergency Use Authorization (EUA). This EUA will remain in effect (meaning this test can be used) for the duration of the COVID-19 declaration under Section 564(b)(1) of the Act, 21 U.S.C. section 360bbb-3(b)(1), unless the authorization is terminated or revoked.  Performed at [facility], [address] [place], [place] [address]   HIV Antibody (routine testing w rflx)     Status: None   Collection Time: 08/22/20  4:35 AM  Result Value Ref Range   HIV Screen 4th Generation wRfx Non Reactive Non Reactive    Comment: Performed at [facility], [address] [place], [place] [address]  Basic metabolic panel     Status: Abnormal   Collection Time: 08/22/20  4:35 AM  Result Value Ref Range   Sodium 138 135 - 145 mmol/L   Potassium  3.6 3.5 - 5.1 mmol/L   Chloride 103 98 - 111 mmol/L   CO2 28 22 - 32 mmol/L   Glucose, Bld 111 (H) 70 - 99 mg/dL    Comment: Glucose reference range applies only to samples taken after fasting for at least 8 hours.   BUN 11 6 - 20 mg/dL   Creatinine, Ser 6.04 0.61 - 1.24 mg/dL   Calcium 8.7 (L) 8.9 - 10.3 mg/dL   GFR, Estimated >54 >09 mL/min    Comment: (NOTE) Calculated using the CKD-EPI Creatinine Equation (2021)    Anion gap 7 5 - 15    Comment: Performed at [facility], [address], [place], [place] [address]  CBC     Status: Abnormal   Collection Time: 08/22/20  4:35 AM  Result Value Ref Range   WBC 4.0 4.0 - 10.5 K/uL   RBC 4.27 4.22 - 5.81 MIL/uL   Hemoglobin 14.3 13.0 - 17.0 g/dL   HCT 47.8 (L) 29.5 - 62.1 %   MCV 90.9 80.0 - 100.0 fL   MCH 33.5 26.0 - 34.0 pg   MCHC 36.9 (H) 30.0 - 36.0 g/dL   RDW 30.8 65.7 - 84.6 %   Platelets 152 150 - 400 K/uL   nRBC 0.0 0.0 - 0.2 %    Comment: Performed at [facility], [address] [place], [place] [address]  Urinalysis, Complete w Microscopic Urine, Clean Catch     Status: None   Collection Time: 08/22/20  7:27 AM  Result Value Ref Range   Color, Urine YELLOW YELLOW   APPearance CLEAR CLEAR   Specific Gravity, Urine 1.010 1.005 - 1.030   pH 7.0 5.0 - 8.0   Glucose, UA NEGATIVE NEGATIVE mg/dL   Hgb urine dipstick NEGATIVE NEGATIVE   Bilirubin Urine NEGATIVE NEGATIVE   Ketones, ur NEGATIVE NEGATIVE mg/dL   Protein, ur NEGATIVE NEGATIVE mg/dL   Nitrite NEGATIVE NEGATIVE   Leukocytes,Ua NEGATIVE NEGATIVE   WBC, UA 0-5 0 - 5 WBC/hpf   Bacteria, UA NONE SEEN NONE SEEN    Comment: Performed at [facility], [address], [place], [place] [address]  Urine rapid drug screen (hosp performed)     Status: None   Collection Time: 08/22/20  7:27 AM  Result Value Ref Range   Opiates NONE DETECTED NONE DETECTED   Cocaine NONE DETECTED NONE DETECTED   Benzodiazepines NONE DETECTED NONE DETECTED   Amphetamines NONE DETECTED NONE  DETECTED   Tetrahydrocannabinol NONE DETECTED NONE DETECTED   Barbiturates NONE DETECTED NONE DETECTED    Comment: (NOTE) DRUG SCREEN FOR MEDICAL PURPOSES ONLY.  IF CONFIRMATION IS NEEDED FOR ANY PURPOSE, NOTIFY LAB WITHIN 5 DAYS.  LOWEST DETECTABLE LIMITS FOR URINE DRUG SCREEN Drug Class                     Cutoff (ng/mL) Amphetamine and metabolites    1000 Barbiturate and metabolites    200 Benzodiazepine                 200 Tricyclics and metabolites  300 Opiates and metabolites        300 Cocaine and metabolites        300 THC                            50 Performed at [facility], [address], [place], [place] [address]   Vitamin B12     Status: None   Collection Time: 08/22/20  7:46 AM  Result Value Ref Range   Vitamin B-12 332 180 - 914 pg/mL    Comment: (NOTE) This assay is not validated for testing neonatal or myeloproliferative syndrome specimens for Vitamin B12 levels. Performed at [facility], [address], [place], [place] [address]   Folate     Status: None   Collection Time: 08/22/20  7:46 AM  Result Value Ref Range   Folate 12.2 >5.9 ng/mL    Comment: Performed at [facility], [address] [place], [place] [address]  CK     Status: Abnormal   Collection Time: 08/22/20  7:46 AM  Result Value Ref Range   Total CK 881 (H) 49 - 397 U/L    Comment: Performed at [facility], [address], [place], [place] [address]  Magnesium     Status: None   Collection Time: 08/22/20  7:46 AM  Result Value Ref Range   Magnesium 2.0 1.7 - 2.4 mg/dL    Comment: Performed at [facility], [address] [place], [place] [address]  Ferritin     Status: Abnormal   Collection Time: 08/22/20  7:46 AM  Result Value Ref Range   Ferritin 725 (H) 24 - 336 ng/mL    Comment: Performed at [facility], [address], [place], [place] [address]  D-dimer, quantitative     Status: Abnormal   Collection Time: 08/22/20  7:46 AM  Result Value Ref Range   D-Dimer, Quant 1.13  (H) 0.00 - 0.50 ug/mL-FEU    Comment: (NOTE) At the manufacturer cut-off value of 0.5 g/mL FEU, this assay has a negative predictive value of 95-100%.This assay is intended for use in conjunction with a clinical pretest probability (PTP) assessment model to exclude pulmonary embolism (PE) and deep venous thrombosis (DVT) in outpatients suspected of PE or DVT. Results should be correlated with clinical presentation. Performed at [facility], [address] [place], [place] [address]    CT Head Wo Contrast  Result Date: 08/21/2020 CLINICAL DATA:  Numbness or tingling. Paresthesias. Patient tested positive for COVID-19 three days ago. EXAM: CT HEAD WITHOUT CONTRAST TECHNIQUE: Contiguous axial images were obtained from the base of the skull through the vertex without intravenous contrast. COMPARISON:  March 01, 2015 FINDINGS: Brain: No evidence of acute infarction, hemorrhage, hydrocephalus, extra-axial collection or mass lesion/mass effect. Vascular: No hyperdense vessel or unexpected calcification. Skull: Normal. Negative for fracture or focal lesion. Sinuses/Orbits: No acute finding. Other: None. IMPRESSION: Normal brain.  No cause for symptoms identified. Electronically Signed   By: Gerome Sam III M.D   On: 08/21/2020 12:20    Pending Labs Unresulted Labs (From admission, onward)    Start     Ordered   08/23/20 0500  CBC  Tomorrow morning,   R       Question:  Specimen collection method  Answer:  Lab=Lab collect   08/22/20 0727   08/23/20 0500  Comprehensive metabolic panel  Tomorrow morning,   R       Question:  Specimen collection  method  Answer:  Lab=Lab collect   08/22/20 0727   08/23/20 0500  C-reactive protein  Tomorrow morning,   R        08/22/20 0734   08/22/20 0727  Vitamin B1  Once,   STAT       Question:  Specimen collection method  Answer:  Lab=Lab collect   08/22/20 0727          Vitals/Pain Today's Vitals   08/22/20 1650 08/22/20 1835 08/22/20 1845  08/22/20 1954  BP:   132/82   Pulse:   71   Resp:   17   Temp:      TempSrc:      SpO2:   96%   Weight:      Height:      PainSc: 0-No pain 5   3     Isolation Precautions Airborne and Contact precautions  Medications Medications  0.9 %  sodium chloride infusion ( Intravenous Stopped 08/22/20 0943)  ondansetron (ZOFRAN) tablet 4 mg (4 mg Oral Given 08/22/20 1841)    Or  ondansetron (ZOFRAN) injection 4 mg ( Intravenous See Alternative 08/22/20 1841)  amLODipine (NORVASC) tablet 10 mg (10 mg Oral Given 08/22/20 0940)  carvedilol (COREG) tablet 6.25 mg (6.25 mg Oral Given 08/22/20 0938)  chlorthalidone (HYGROTON) tablet 12.5 mg (12.5 mg Oral Given 08/22/20 0939)  spironolactone (ALDACTONE) tablet 25 mg (25 mg Oral Given 08/22/20 0940)  irbesartan (AVAPRO) tablet 300 mg (300 mg Oral Given 08/22/20 0940)  pantoprazole (PROTONIX) EC tablet 40 mg (40 mg Oral Given 08/22/20 0939)  loratadine (CLARITIN) tablet 10 mg (10 mg Oral Given 08/22/20 0940)  acetaminophen (TYLENOL) tablet 650 mg (650 mg Oral Given 08/22/20 1842)  0.9 % NaCl with KCl 20 mEq/ L  infusion ( Intravenous New Bag/Given 08/22/20 0935)  sodium chloride 0.9 % bolus 1,000 mL (0 mLs Intravenous Stopped 08/21/20 1245)  potassium chloride SA (KLOR-CON) CR tablet 40 mEq (40 mEq Oral Given 08/21/20 1127)  lidocaine (PF) (XYLOCAINE) 1 % injection 10 mL (10 mLs Intradermal Given 08/21/20 1438)  potassium chloride SA (KLOR-CON) CR tablet 10 mEq (10 mEq Oral Given 08/22/20 8676)    Mobility walks Moderate fall risk   Focused Assessments    R Recommendations: See Admitting Provider Note  Report given to:   Additional Notes:

## 2020-08-22 NOTE — ED Notes (Addendum)
Pt. Provided with lunch and ice water at bedside. Pt. Was able to ambulate around room with steady gait.

## 2020-08-22 NOTE — ED Notes (Signed)
Attempted report x1. 

## 2020-08-23 ENCOUNTER — Encounter (HOSPITAL_COMMUNITY): Payer: Self-pay | Admitting: Family Medicine

## 2020-08-23 ENCOUNTER — Inpatient Hospital Stay (HOSPITAL_COMMUNITY): Payer: Commercial Managed Care - PPO

## 2020-08-23 DIAGNOSIS — R531 Weakness: Secondary | ICD-10-CM | POA: Diagnosis not present

## 2020-08-23 DIAGNOSIS — U071 COVID-19: Secondary | ICD-10-CM | POA: Diagnosis not present

## 2020-08-23 DIAGNOSIS — R292 Abnormal reflex: Secondary | ICD-10-CM

## 2020-08-23 LAB — COMPREHENSIVE METABOLIC PANEL
ALT: 53 U/L — ABNORMAL HIGH (ref 0–44)
AST: 43 U/L — ABNORMAL HIGH (ref 15–41)
Albumin: 3.8 g/dL (ref 3.5–5.0)
Alkaline Phosphatase: 41 U/L (ref 38–126)
Anion gap: 8 (ref 5–15)
BUN: 11 mg/dL (ref 6–20)
CO2: 28 mmol/L (ref 22–32)
Calcium: 9.5 mg/dL (ref 8.9–10.3)
Chloride: 102 mmol/L (ref 98–111)
Creatinine, Ser: 0.72 mg/dL (ref 0.61–1.24)
GFR, Estimated: >60 mL/min (ref >60)
Glucose, Bld: 102 mg/dL — ABNORMAL HIGH (ref 70–99)
Potassium: 3.9 mmol/L (ref 3.5–5.1)
Sodium: 138 mmol/L (ref 135–145)
Total Bilirubin: 0.6 mg/dL (ref 0.3–1.2)
Total Protein: 6.7 g/dL (ref 6.5–8.1)

## 2020-08-23 LAB — CBC
HCT: 39.5 % (ref 39.0–52.0)
Hemoglobin: 14.5 g/dL (ref 13.0–17.0)
MCH: 32.6 pg (ref 26.0–34.0)
MCHC: 36.7 g/dL — ABNORMAL HIGH (ref 30.0–36.0)
MCV: 88.8 fL (ref 80.0–100.0)
Platelets: 161 10*3/uL (ref 150–400)
RBC: 4.45 MIL/uL (ref 4.22–5.81)
RDW: 11.8 % (ref 11.5–15.5)
WBC: 5.7 10*3/uL (ref 4.0–10.5)
nRBC: 0.4 % — ABNORMAL HIGH (ref 0.0–0.2)

## 2020-08-23 LAB — VITAMIN B12: Vitamin B-12: 305 pg/mL (ref 180–914)

## 2020-08-23 LAB — CK: Total CK: 485 U/L — ABNORMAL HIGH (ref 49–397)

## 2020-08-23 MED ORDER — GADOBUTROL 1 MMOL/ML IV SOLN
10.0000 mL | Freq: Once | INTRAVENOUS | Status: AC | PRN
  Administered 2020-08-23: 10 mL via INTRAVENOUS

## 2020-08-23 NOTE — Progress Notes (Signed)
PROGRESS NOTE  Benjamin Chang VZD:[id number] DOB: [birth year] DOA: 08/21/2020 PCP: Selinda Flavin, MD   LOS: 2 days   Brief Narrative / Interim history: 25 year old male with a history of hypertension and GERD presenting with 3 to 4-day history of arm and leg weakness that was preceded by numbness and tingling and pain.  Patient states that he began having fevers and chills on the evening of 08/17/2020.  He took a home COVID test that was positive on 08/18/2020.  On 08/18/2020, he began having tingling and pain that was a sending from his feet and eventually progressing up to his arms by 08/19/2020.  Starting on 08/20/2020, he began having some leg weakness and difficulty walking.  He did not have a fall, and he was able to ambulate although he needed to hold onto the wall.  His weakness continued to migrate up to his arms and hands to the point where he was having difficulty holding onto objects.  He denied any dysarthria, visual disturbance, shortness of breath, facial droop  Subjective / 24h Interval events: His arms are almost back to normal.  Still has some weakness in his legs but able to ambulate some  Assessment & Plan: Principal Problem Leg weakness/arm weakness/sensory disturbance-unclear etiology, neurology to see.  Obtain an MRI of the brain as well as a C-spine with and without contrast -CSF did not show classic albumin no cytologic dissociation -Appreciate neuro follow-up  Active Problems COVID-19 infection -Unvaccinated, he appears stable from the standpoint.  Asymptomatic now, on room air  Essential hypertension-continue home medications none none   Hypokalemia-replete and monitor.  Magnesium normal  Scheduled Meds:  amLODipine  10 mg Oral Daily   carvedilol  6.25 mg Oral BID   chlorthalidone  12.5 mg Oral Daily   irbesartan  300 mg Oral Daily   loratadine  10 mg Oral Daily   pantoprazole  40 mg Oral Daily   spironolactone  25 mg Oral Daily   Continuous Infusions:   sodium chloride 100 mL/hr at 08/23/20 1053   PRN Meds:.acetaminophen, ondansetron **OR** ondansetron (ZOFRAN) IV  Diet Orders (From admission, onward)     Start     Ordered   08/21/20 1829  Diet Heart Room service appropriate? Yes; Fluid consistency: Thin  Diet effective now       Question Answer Comment  Room service appropriate? Yes   Fluid consistency: Thin      08/21/20 1828            DVT prophylaxis: SCDs Start: 08/21/20 2126     Code Status: Full Code  Family Communication: Girlfriend present at bedside  Status is: Inpatient  Remains inpatient appropriate because:Inpatient level of care appropriate due to severity of illness  Dispo: The patient is from: Home              Anticipated d/c is to: Home              Patient currently is not medically stable to d/c.   Difficult to place patient No   Level of care: Med-Surg  Consultants:  Neurology   Procedures:  none  Microbiology  none  Antimicrobials: none    Objective: Vitals:   08/22/20 1330 08/22/20 1615 08/22/20 1845 08/22/20 2155  BP: 139/89 128/84 132/82 122/77  Pulse: 78 79 71   Resp: 16 18 17 18   Temp:    98.6 F (37 C)  TempSrc:    Oral  SpO2: 95% 96% 96% 95%  Weight:  Height:        Intake/Output Summary (Last 24 hours) at 08/23/2020 1120 Last data filed at 08/23/2020 0837 Gross per 24 hour  Intake 1440.5 ml  Output 2500 ml  Net -1059.5 ml   Filed Weights   08/21/20 0941  Weight: 108.9 kg    Examination:  Constitutional: NAD Eyes: no scleral icterus ENMT: Mucous membranes are moist.  Neck: normal, supple Respiratory: clear to auscultation bilaterally, no wheezing, no crackles. Normal respiratory effort. Cardiovascular: Regular rate and rhythm, no murmurs / rubs / gallops. No LE edema Abdomen: non distended, no tenderness. Bowel sounds positive.  Musculoskeletal: no clubbing / cyanosis.  Skin: no rashes Neurologic: no focal deficits  Data Reviewed: I have  independently reviewed following labs and imaging studies   CBC: Recent Labs  Lab 08/21/20 1027 08/22/20 0435  WBC 6.0 4.0  NEUTROABS 3.0  --   HGB 16.3 14.3  HCT 43.8 38.8*  MCV 89.4 90.9  PLT 162 152   Basic Metabolic Panel: Recent Labs  Lab 08/21/20 1027 08/22/20 0435 08/22/20 0746  NA 135 138  --   K 3.1* 3.6  --   CL 99 103  --   CO2 26 28  --   GLUCOSE 108* 111*  --   BUN 12 11  --   CREATININE 0.70 0.71  --   CALCIUM 8.5* 8.7*  --   MG  --   --  2.0   Liver Function Tests: Recent Labs  Lab 08/21/20 1027  AST 38  ALT 37  ALKPHOS 56  BILITOT 0.4  PROT 7.5  ALBUMIN 4.1   Coagulation Profile: No results for input(s): INR, PROTIME in the last 168 hours. HbA1C: No results for input(s): HGBA1C in the last 72 hours. CBG: No results for input(s): GLUCAP in the last 168 hours.  Recent Results (from the past 240 hour(s))  CSF culture     Status: None (Preliminary result)   Collection Time: 08/21/20  3:20 PM   Specimen: Back; Cerebrospinal Fluid  Result Value Ref Range Status   Specimen Description   Final    BACK Performed at [facility], [address], [place], [place] [address]    Special Requests   Final    NONE Performed at [facility], [address], [place], [place] [address]    Gram Stain   Final    WBC PRESENT,BOTH PMN AND MONONUCLEAR NO ORGANISMS SEEN CYTOSPIN SMEAR    Culture   Final    NO GROWTH 2 DAYS Performed at [facility], [address] [place], [place] [address]    Report Status PENDING  Incomplete  Gram stain     Status: None   Collection Time: 08/21/20  3:20 PM   Specimen: CSF; Cerebrospinal Fluid  Result Value Ref Range Status   Specimen Description CSF  Final   Special Requests NONE  Final   Gram Stain   Final    NO ORGANISMS SEEN RARE WBC SEEN CRITICAL RESULT CALLED TO, READ BACK BY AND VERIFIED WITH: TURNER,C 1924 08/21/2020 COLEMAN,R Performed at [facility], [address], [place], [place] [address]     Report Status 08/21/2020 FINAL  Final  Resp Panel by RT-PCR (Flu A&B, Covid) Nasopharyngeal Swab     Status: Abnormal   Collection Time: 08/21/20  5:05 PM   Specimen: Nasopharyngeal Swab; Nasopharyngeal(NP) swabs in vial transport medium  Result Value Ref Range Status   SARS Coronavirus 2 by RT PCR POSITIVE (A) NEGATIVE  Final    Comment: RESULT CALLED TO, READ BACK BY AND VERIFIED WITH: MARTIN,D @ 1800 ON 08/21/20 BY JUW (NOTE) SARS-CoV-2 target nucleic acids are DETECTED.  The SARS-CoV-2 RNA is generally detectable in upper respiratory specimens during the acute phase of infection. Positive results are indicative of the presence of the identified virus, but do not rule out bacterial infection or co-infection with other pathogens not detected by the test. Clinical correlation with patient history and other diagnostic information is necessary to determine patient infection status. The expected result is Negative.  Fact Sheet for Patients: BloggerCourse.com  Fact Sheet for Healthcare Providers: SeriousBroker.it  This test is not yet approved or cleared by the [place] FDA and  has been authorized for detection and/or diagnosis of SARS-CoV-2 by FDA under an Emergency Use Authorization (EUA).  This EUA will remain in effect (meaning this test can be  used) for the duration of  the COVID-19 declaration under Section 564(b)(1) of the Act, 21 U.S.C. section 360bbb-3(b)(1), unless the authorization is terminated or revoked sooner.     Influenza A by PCR NEGATIVE NEGATIVE Final   Influenza B by PCR NEGATIVE NEGATIVE Final    Comment: (NOTE) The Xpert Xpress SARS-CoV-2/FLU/RSV plus assay is intended as an aid in the diagnosis of influenza from Nasopharyngeal swab specimens and should not be used as a sole basis for treatment. Nasal washings and aspirates are unacceptable for Xpert Xpress SARS-CoV-2/FLU/RSV testing.  Fact Sheet for  Patients: BloggerCourse.com  Fact Sheet for Healthcare Providers: SeriousBroker.it  This test is not yet approved or cleared by the [place] FDA and has been authorized for detection and/or diagnosis of SARS-CoV-2 by FDA under an Emergency Use Authorization (EUA). This EUA will remain in effect (meaning this test can be used) for the duration of the COVID-19 declaration under Section 564(b)(1) of the Act, 21 U.S.C. section 360bbb-3(b)(1), unless the authorization is terminated or revoked.  Performed at [facility], [address] [place], [place] [address]      Radiology Studies: No results found.   Pamella Pert, MD, PhD [facility]  Between 7 am - 7 pm I am available, please contact me via Amion (for emergencies) or Securechat (non urgent messages)  Between 7 pm - 7 am I am not available, please contact night coverage MD/APP via Amion

## 2020-08-23 NOTE — Consult Note (Signed)
NEUROLOGY CONSULTATION NOTE   Date of service: August 23, 2020 Patient Name: Benjamin Chang MRN:  [id number] DOB:  03-04-1995 Reason for consult: ascending numbness and weakness in setting of covid Requesting physician: Dr. Pamella Pert _ _ _   _ __   _ __ _ _  __ __   _ __   __ _  History of Present Illness   This is a 25 year old gentleman with a history of hypertension who presented with 3 to 4 days of ascending weakness in the setting of acute COVID infection.  He initially noticed numbness and tingling of his bilateral feet.  He then began to have some gait difficulty and some weakness in his bilateral legs.  This then progressed up to his arms the following day.  Over the past 2 days however he states that it has improved significantly without intervention.  His sensation is almost back to normal.  He does have persistent weakness in his left grip and mild weakness more prominent in the left leg than the right.  He is able to ambulate with assistance.  He underwent LP while awaiting transfer to [facility] which did not show pleocytosis and did not show elevated protein.  He has maintained his reflexes throughout.  He is actually somewhat brisk on the left side today.  An MRI brain and C-spine with and without contrast are pending.   ROS   Per HPI; all other systems reviewed and were negative  Past History   Past Medical History:  Diagnosis Date   Atypical chest pain    Essential hypertension    GERD (gastroesophageal reflux disease)    Past Surgical History:  Procedure Laterality Date   LAPAROSCOPIC APPENDECTOMY N/A 11/04/2015   Procedure: APPENDECTOMY LAPAROSCOPIC;  Surgeon: Ancil Linsey, MD;  Location: [facility];  Service: General;  Laterality: N/A;   TONSILLECTOMY     Family History  Problem Relation Age of Onset   Heart disease Other    Heart disease Maternal Grandmother    Hypertension Maternal Grandmother    Heart attack Paternal Grandfather    Diabetes Paternal  Grandfather    Hypertension Paternal Grandfather    Dementia Paternal Grandfather    Hypertension Mother    Heart disease Mother    Hypertension Father    Heart disease Father    Aneurysm Maternal Grandfather    Hypertension Paternal Grandmother    Social History   Socioeconomic History   Marital status: Single    Spouse name: Not on file   Number of children: Not on file   Years of education: Not on file   Highest education level: Not on file  Occupational History   Occupation: Holiday representative  Tobacco Use   Smoking status: Some Days    Packs/day: 0.50    Types: Cigarettes   Smokeless tobacco: Never  Vaping Use   Vaping Use: Never used  Substance and Sexual Activity   Alcohol use: No   Drug use: No   Sexual activity: Not on file  Other Topics Concern   Not on file  Social History Narrative   ** Merged History Encounter **       Social Determinants of Health   Financial Resource Strain: Not on file  Food Insecurity: Not on file  Transportation Needs: Not on file  Physical Activity: Not on file  Stress: Not on file  Social Connections: Not on file   Allergies  Allergen Reactions   Lisinopril Swelling    Lips  swell    Medications   Medications Prior to Admission  Medication Sig Dispense Refill Last Dose   amLODipine (NORVASC) 10 MG tablet Take 1 tablet (10 mg total) by mouth daily. 90 tablet 2 08/20/2020   carvedilol (COREG) 6.25 MG tablet Take 1 tablet (6.25 mg total) by mouth 2 (two) times daily. 180 tablet 3 08/20/2020 at 1900   cetirizine (ZYRTEC) 10 MG tablet Take 10 mg by mouth daily.   Past Week   chlorthalidone (HYGROTON) 25 MG tablet TAKE 1/2 TABLET BY MOUTH DAILY (REPLACES HYDROCHLOROTHIAZIDE) (Patient taking differently: Take 12.5 mg by mouth daily. TAKE 1/2 TABLET BY MOUTH DAILY (REPLACES HYDROCHLOROTHIAZIDE)) 45 tablet 3 08/20/2020   Multiple Vitamins-Minerals (MULTIVITAMIN WITH MINERALS) tablet Take 1 tablet by mouth daily.   08/20/2020   Omega-3  Fatty Acids (OMEGA-3 2100 PO) Take 1 capsule by mouth daily.   08/20/2020   omeprazole (PRILOSEC) 20 MG capsule TAKE 1 CAPSULE BY MOUTH ONCE A DAY 30 capsule 5 08/20/2020   spironolactone (ALDACTONE) 25 MG tablet Take 1 tablet (25 mg total) by mouth daily. 90 tablet 3 08/20/2020   valsartan (DIOVAN) 320 MG tablet Take 1 tablet (320 mg total) by mouth daily. 90 tablet 1 08/20/2020     Vitals   Vitals:   08/22/20 2155 08/23/20 0842 08/23/20 1300 08/23/20 1717  BP: 122/77 127/80 124/72 133/79  Pulse:  78 72 72  Resp: 18 20 20 18   Temp: 98.6 F (37 C) 98.4 F (36.9 C) 98.6 F (37 C) 98.9 F (37.2 C)  TempSrc: Oral Oral Oral Oral  SpO2: 95% 95% 95% 95%  Weight:      Height:         Body mass index is 30 kg/m.  Physical Exam   Physical Exam Gen: A&O x4, NAD HEENT: Atraumatic, normocephalic;mucous membranes moist; oropharynx clear, tongue without atrophy or fasciculations. Neck: Supple, trachea midline. Resp: CTAB, no w/r/r CV: RRR, no m/g/r; nml S1 and S2. 2+ symmetric peripheral pulses. Abd: soft/NT/ND; nabs x 4 quad Extrem: Nml bulk; no cyanosis, clubbing, or edema.  Neuro: *MS: A&O x4. Follows multi-step commands.  *Speech: fluid, nondysarthric, able to name and repeat *CN:    I: Deferred   II,III: PERRLA, VFF by confrontation, optic discs sharp   III,IV,VI: EOMI w/o nystagmus, no ptosis   V: Sensation intact from V1 to V3 to LT   VII: Eyelid closure was full.  Smile symmetric.   VIII: Hearing intact to voice   IX,X: Voice normal, palate elevates symmetrically    XI: SCM/trap 5/5 bilat   XII: Tongue protrudes midline, no atrophy or fasciculations  *Motor:   Normal bulk.  No tremor, rigidity or bradykinesia. No pronator drift. BUE full strength except 4+/5 L grip. RLE full strength on my examination, mild flexor-predom weakness LLE. *Sensory: Intact to light touch, pinprick, temperature vibration throughout. Symmetric. Propioception intact bilat.  No double-simultaneous  extinction.  *Coordination:  Finger-to-nose, heel-to-shin, rapid alternating motions were intact. *Reflexes:  2+ throughout but more brisk on L, toes mute bilat *Gait:deferred  Labs   CBC:  Recent Labs  Lab 08/21/20 1027 08/22/20 0435 08/23/20 0232  WBC 6.0 4.0 5.7  NEUTROABS 3.0  --   --   HGB 16.3 14.3 14.5  HCT 43.8 38.8* 39.5  MCV 89.4 90.9 88.8  PLT 162 152 161    Basic Metabolic Panel:  Lab Results  Component Value Date   NA 138 08/23/2020   K 3.9 08/23/2020   CO2 28 08/23/2020  GLUCOSE 102 (H) 08/23/2020   BUN 11 08/23/2020   CREATININE 0.72 08/23/2020   CALCIUM 9.5 08/23/2020   GFRNONAA >60 08/23/2020   GFRAA >60 10/01/2019   Lipid Panel: No results found for: LDLCALC HgbA1c: No results found for: HGBA1C Urine Drug Screen:     Component Value Date/Time   LABOPIA NONE DETECTED 08/22/2020 0727   COCAINSCRNUR NONE DETECTED 08/22/2020 0727   LABBENZ NONE DETECTED 08/22/2020 0727   AMPHETMU NONE DETECTED 08/22/2020 0727   THCU NONE DETECTED 08/22/2020 0727   LABBARB NONE DETECTED 08/22/2020 0727    Alcohol Level     Component Value Date/Time   ETH <5 03/01/2015 0121     Impression   This is a 25 year old gentleman with a history of hypertension who presented with 3 to 4 days of ascending weakness in the setting of acute COVID infection. Interestingly his sx have improved without intervention although he still has mild weakness LUE and LLE (I could not appreciate any weakness on R side today). There was initially concern for GBS but given maintained reflexes (actually brisk on L), no albuminocytologic dissociation, and improvement without intervention GBS is highly unlikely. Given persistent L sided weakness with hyperreflexia on that side c/f central process. MRI brain and c spine wwo pending for further evaluation. CK elevated to 881 on admission -> 485 today. 1/3 of patients with acute covid infection will have elevated CK and this may be unrelated to his  weakness  Recommendations   - MRI brain and c spine wwo - PT/OT  Will continue to follow. ______________________________________________________________________   Thank you for the opportunity to take part in the care of this patient. If you have any further questions, please contact the neurology consultation attending.  Signed,  Bing Neighbors, MD [facility] [phone]  If 7pm- 7am, please page neurology on call as listed in AMION.

## 2020-08-23 NOTE — Plan of Care (Signed)

## 2020-08-24 DIAGNOSIS — R531 Weakness: Secondary | ICD-10-CM | POA: Diagnosis not present

## 2020-08-24 DIAGNOSIS — U071 COVID-19: Secondary | ICD-10-CM | POA: Diagnosis not present

## 2020-08-24 LAB — COMPREHENSIVE METABOLIC PANEL
ALT: 54 U/L — ABNORMAL HIGH (ref 0–44)
AST: 37 U/L (ref 15–41)
Albumin: 3.7 g/dL (ref 3.5–5.0)
Alkaline Phosphatase: 49 U/L (ref 38–126)
Anion gap: 7 (ref 5–15)
BUN: 12 mg/dL (ref 6–20)
CO2: 27 mmol/L (ref 22–32)
Calcium: 9.4 mg/dL (ref 8.9–10.3)
Chloride: 101 mmol/L (ref 98–111)
Creatinine, Ser: 0.73 mg/dL (ref 0.61–1.24)
GFR, Estimated: >60 mL/min (ref >60)
Glucose, Bld: 98 mg/dL (ref 70–99)
Potassium: 3.7 mmol/L (ref 3.5–5.1)
Sodium: 135 mmol/L (ref 135–145)
Total Bilirubin: 0.6 mg/dL (ref 0.3–1.2)
Total Protein: 6.3 g/dL — ABNORMAL LOW (ref 6.5–8.1)

## 2020-08-24 LAB — CBC
HCT: 39.5 % (ref 39.0–52.0)
Hemoglobin: 14.4 g/dL (ref 13.0–17.0)
MCH: 32.3 pg (ref 26.0–34.0)
MCHC: 36.5 g/dL — ABNORMAL HIGH (ref 30.0–36.0)
MCV: 88.6 fL (ref 80.0–100.0)
Platelets: 186 10*3/uL (ref 150–400)
RBC: 4.46 MIL/uL (ref 4.22–5.81)
RDW: 11.8 % (ref 11.5–15.5)
WBC: 5.2 10*3/uL (ref 4.0–10.5)
nRBC: 0 % (ref 0.0–0.2)

## 2020-08-24 NOTE — Progress Notes (Signed)
Pt discharged to home. DC instructions given with father at bedside. No concerns voiced. Left unit in wheelchair pushed by nurse tech. Left in stable condition.

## 2020-08-24 NOTE — Evaluation (Signed)
Occupational Therapy Evaluation and discharge Patient Details Name: Benjamin Chang MRN: [id number] DOB: [birth year] Today's Date: 08/24/2020    History of Present Illness Pt is a 25 y/o male admitted 08/21/20 presented with 3 to 4 days of ascending weakness in the setting of acute COVID infection. Symptoms included: numbness, tingling in BLE, gait difficulty - which progressed to BUE. MRI brain was normal and C-spine revealed Mild right C3-4 neural foraminal narrowing due to uncovertebral osteophyte. After a few days in the hospital he feels as though symptoms have resolved with the exception of He does have persistent weakness in his left grip and mild weakness more prominent in the left leg than the right. PMH includes: Atypical chest pain, Essential HTN, and GERD.   Clinical Impression   Pt is typically independent in ADL and mobility, drives, works full time in Administrator, sports. Today Pt is overall modified independent for bed mobility, transfers and mobility in the room, LB and UB AD in sitting and standing. During MMT L side tested slightly weaker and took increased time for coordination than Left - but was overall functional and did not have any LOB, drop any grooming tools. Encouraged use of non-dominant left hand and energy conservation until he feels back to normal. Currently he reports feeling like his left side is at about 80%. Fine motor exercise handout provided and energy conservation sheet provided. If weakness persists he should seek OPOT but currently not recommending post-acute follow up.   Of note: Pt had an initial headache of 5, it got worse (up to a 7) with standing activity. BP WFL.    Follow Up Recommendations  No OT follow up;Supervision - Intermittent (if weakness persists - should get OP consult)    Equipment Recommendations  None recommended by OT    Recommendations for Other Services       Precautions / Restrictions Precautions Precaution Comments: COVID  precautions Restrictions Weight Bearing Restrictions: No      Mobility Bed Mobility Overal bed mobility: Modified Independent                  Transfers Overall transfer level: Modified independent Equipment used: None             General transfer comment: when walking Pt is able to manage his own IV pole    Balance Overall balance assessment: Mild deficits observed, not formally tested                                         ADL either performed or assessed with clinical judgement   ADL Overall ADL's : Modified independent                                       General ADL Comments: increased time and effort, but able to complete ADL at mod I level for LB dressing, transfers, sink level grooming     Vision Patient Visual Report: No change from baseline       Perception     Praxis      Pertinent Vitals/Pain Pain Assessment: 0-10 Pain Score: 5  Pain Location: headache Pain Descriptors / Indicators: Dull;Headache Pain Intervention(s): Monitored during session;Repositioned     Hand Dominance Right   Extremity/Trunk Assessment Upper Extremity Assessment Upper Extremity Assessment: LUE deficits/detail LUE Deficits /  Details: grossly 4/5 weakness - able to perform full AROM but weaker than Right. Pt reports that it is 80% of normal - fine motor takes increased concentration and time but is able to perform LUE Sensation: WNL LUE Coordination: decreased fine motor;decreased gross motor   Lower Extremity Assessment Lower Extremity Assessment: Defer to PT evaluation   Cervical / Trunk Assessment Cervical / Trunk Assessment: Normal   Communication Communication Communication: No difficulties   Cognition Arousal/Alertness: Awake/alert Behavior During Therapy: WFL for tasks assessed/performed Overall Cognitive Status: Within Functional Limits for tasks assessed                                      General Comments  Pt's father in room. After standing and completing sink level grooming Pt reporting increased headache. BP - MAP was 95. 135/?? (unable to recall at this time)    Exercises     Shoulder Instructions      Home Living Family/patient expects to be discharged to:: Private residence Living Arrangements: Spouse/significant other Available Help at Discharge: Friend(s);Family;Available 24 hours/day Type of Home: House Home Access: Stairs to enter [employer] of Steps: 4 Entrance Stairs-Rails: Right Home Layout: One level     Bathroom Shower/Tub: Producer, television/film/video: Standard     Home Equipment: None          Prior Functioning/Environment Level of Independence: Independent        Comments: drives, works as a Print production planner Problem List: Decreased strength      OT Treatment/Interventions:      OT Goals(Current goals can be found in the care plan section) Acute Rehab OT Goals Patient Stated Goal: to get home and get back to work OT Goal Formulation: With patient Time For Goal Achievement: 09/07/20 Potential to Achieve Goals: Good  OT Frequency:     Barriers to D/C:            Co-evaluation              AM-PAC OT "6 Clicks" Daily Activity     Outcome Measure Help from another person eating meals?: None Help from another person taking care of personal grooming?: None Help from another person toileting, which includes using toliet, bedpan, or urinal?: None Help from another person bathing (including washing, rinsing, drying)?: None Help from another person to put on and taking off regular upper body clothing?: None Help from another person to put on and taking off regular lower body clothing?: None 6 Click Score: 24   End of Session Nurse Communication: Mobility status;Other (comment) (headache)  Activity Tolerance: Patient tolerated treatment well Patient left: in bed;with call bell/phone within reach;with  family/visitor present  OT Visit Diagnosis: Muscle weakness (generalized) (M62.81);Pain Pain - Right/Left:  (headache) Pain - part of body:  (headache)                Time: 4166-0630 OT Time Calculation (min): 24 min Charges:  OT General Charges $OT Visit: 1 Visit OT Evaluation $OT Eval Low Complexity: 1 Low OT Treatments $Self Care/Home Management : 8-22 mins  Benjamin Chang OTR/L Acute Rehabilitation Services Pager: [phone] Office: [phone]  Benjamin Chang 08/24/2020, 1:10 PM

## 2020-08-24 NOTE — Evaluation (Signed)
Physical Therapy Evaluation Patient Details Name: Benjamin Chang MRN: [id number] DOB: Dec 23, 1995 Today's Date: 08/24/2020   History of Present Illness  Pt is a 25 y/o male admitted 08/21/20 presented with 3 to 4 days of ascending weakness in the setting of acute COVID infection. Symptoms included: numbness, tingling in BLE, gait difficulty - which progressed to BUE. MRI brain was normal and C-spine revealed Mild right C3-4 neural foraminal narrowing due to uncovertebral osteophyte. After a few days in the hospital he feels as though symptoms have resolved with the exception of He does have persistent weakness in his left grip and mild weakness more prominent in the left leg than the right. PMH includes: Atypical chest pain, Essential HTN, and GERD.  Clinical Impression  Patient evaluated by Physical Therapy with no further acute PT needs identified. All education has been completed and the patient has no further questions. Pt mod I with bed mobility and transfers. Pt noted to have mild instability with dynamic gait tasks, but was able to recover without physical assist. Feel he would benefit from outpatient PT at d/c to address higher level balance issues. Further needs can be deferred to outpatient PT.  See below for any follow-up Physical Therapy or equipment needs. PT is signing off. Thank you for this referral. If needs change, please re-consult.      Follow Up Recommendations Outpatient PT    Equipment Recommendations  None recommended by PT    Recommendations for Other Services       Precautions / Restrictions Precautions Precautions: Other (comment) Precaution Comments: COVID precautions Restrictions Weight Bearing Restrictions: No      Mobility  Bed Mobility Overal bed mobility: Modified Independent                  Transfers Overall transfer level: Modified independent Equipment used: None             General transfer comment: when walking Pt is able to  manage his own IV pole  Ambulation/Gait Ambulation/Gait assistance: Supervision Gait Distance (Feet): 30 Feet Assistive device: IV Pole Gait Pattern/deviations: Step-through pattern;Decreased stride length Gait velocity: Decreased   General Gait Details: Mild unsteadiness noted when performing vertical head turns and when stepping over obstacles. Required supervision for safety.  Stairs            Wheelchair Mobility    Modified Rankin (Stroke Patients Only)       Balance Overall balance assessment: Mild deficits observed, not formally tested                               Standardized Balance Assessment Standardized Balance Assessment : Dynamic Gait Index   Dynamic Gait Index Level Surface: Mild Impairment Change in Gait Speed: Normal Gait with Horizontal Head Turns: Normal Gait with Vertical Head Turns: Moderate Impairment Gait and Pivot Turn: Mild Impairment Step Over Obstacle: Moderate Impairment Step Around Obstacles: Mild Impairment       Pertinent Vitals/Pain Pain Assessment: 0-10 Pain Score: 4  Pain Location: headache Pain Descriptors / Indicators: Dull;Headache Pain Intervention(s): Limited activity within patient's tolerance;Monitored during session;Repositioned    Home Living Family/patient expects to be discharged to:: Private residence Living Arrangements: Spouse/significant other Available Help at Discharge: Friend(s);Family;Available 24 hours/day Type of Home: House Home Access: Stairs to enter Entrance Stairs-Rails: Right Entrance Stairs-Number of Steps: 4 Home Layout: One level Home Equipment: None      Prior Function Level of Independence:  Independent         Comments: drives, works as a Hydrographic surveyor: Right    Extremity/Trunk Assessment   Upper Extremity Assessment Upper Extremity Assessment: Defer to OT evaluation LUE Deficits / Details: grossly 4/5 weakness - able to perform  full AROM but weaker than Right. Pt reports that it is 80% of normal - fine motor takes increased concentration and time but is able to perform LUE Sensation: WNL LUE Coordination: decreased fine motor;decreased gross motor    Lower Extremity Assessment Lower Extremity Assessment: LLE deficits/detail LLE Deficits / Details: Reports sensation less on LLE as compared to RLE. 3/5 at hip flexors and 3+/5 at knee extensors.    Cervical / Trunk Assessment Cervical / Trunk Assessment: Normal  Communication   Communication: No difficulties  Cognition Arousal/Alertness: Awake/alert Behavior During Therapy: WFL for tasks assessed/performed Overall Cognitive Status: Within Functional Limits for tasks assessed                                        General Comments General comments (skin integrity, edema, etc.): Pt's father present during session    Exercises     Assessment/Plan    PT Assessment All further PT needs can be met in the next venue of care  PT Problem List Decreased strength;Decreased balance;Decreased mobility;Decreased coordination       PT Treatment Interventions      PT Goals (Current goals can be found in the Care Plan section)  Acute Rehab PT Goals Patient Stated Goal: to get home and get back to work PT Goal Formulation: With patient Time For Goal Achievement: 08/24/20 Potential to Achieve Goals: Good    Frequency     Barriers to discharge        Co-evaluation               AM-PAC PT "6 Clicks" Mobility  Outcome Measure Help needed turning from your back to your side while in a flat bed without using bedrails?: None Help needed moving from lying on your back to sitting on the side of a flat bed without using bedrails?: None Help needed moving to and from a bed to a chair (including a wheelchair)?: None Help needed standing up from a chair using your arms (e.g., wheelchair or bedside chair)?: None Help needed to walk in hospital  room?: None Help needed climbing 3-5 steps with a railing? : A Little 6 Click Score: 23    End of Session   Activity Tolerance: Patient tolerated treatment well Patient left: in bed;with call bell/phone within reach;with family/visitor present Nurse Communication: Mobility status PT Visit Diagnosis: Unsteadiness on feet (R26.81);Muscle weakness (generalized) (M62.81)    Time: 2993-7169 PT Time Calculation (min) (ACUTE ONLY): 23 min   Charges:   PT Evaluation $PT Eval Low Complexity: 1 Low PT Treatments $Gait Training: 8-22 mins        Lou Miner, DPT  Acute Rehabilitation Services  Pager: [phone] Office: [phone]   Rudean Hitt 08/24/2020, 3:38 PM

## 2020-08-25 LAB — CSF CULTURE W GRAM STAIN: Culture: NO GROWTH

## 2020-08-25 LAB — C-REACTIVE PROTEIN

## 2020-08-25 NOTE — Discharge Summary (Signed)
Physician Discharge Summary   Benjamin Chang DOB: 02/14/1995 DOA: 08/21/2020  PCP: Selinda Flavin, MD  Admit date: 08/21/2020 Discharge date: 08/24/2020   Admitted From: home Disposition:  home Discharging physician: Lewie Chamber, MD  Recommendations for Outpatient Follow-up:  Outpt PT scheduled at discharge   Home Health:  Equipment/Devices:   Patient discharged to home in Discharge Condition: stable Risk of unplanned readmission score: Unplanned Admission- Pilot do not use: 9.4  CODE STATUS: Full Diet recommendation:  Diet Orders (From admission, onward)     Start     Ordered   08/24/20 0000  Diet - low sodium heart healthy        08/24/20 1432            Hospital Course:  24 year old male with a history of hypertension and GERD presenting with 3 to 4-day history of arm and leg weakness that was preceded by numbness and tingling and pain.  Patient states that he began having fevers and chills on the evening of 08/17/2020.  He took a home COVID test that was positive on 08/18/2020.  On 08/18/2020, he began having tingling and pain that was ascending from his feet and eventually progressing up to his arms by 08/19/2020.  Starting on 08/20/2020, he began having some leg weakness and difficulty walking.  He did not have a fall, and he was able to ambulate although he needed to hold onto the wall.  His weakness continued to migrate up to his arms and hands to the point where he was having difficulty holding onto objects.  He denied any dysarthria, visual disturbance, shortness of breath, facial droop.   He underwent further work-up with LP and MRI brain.  LP was unremarkable including no concern for underlying GBS.  MRI brain also did not show any demyelinating lesions. Neurology was consulted and also followed along during hospitalization. Given overall negative work-up from a neurologic standpoint, his symptoms were felt possibly due to underlying COVID-19 infection  contributing to his symptoms. He was evaluated by physical therapy with recommendations for outpatient PT at discharge which was scheduled.   The patient's chronic medical conditions were treated accordingly per the patient's home medication regimen except as noted.  On day of discharge, patient was felt deemed stable for discharge. Patient/family member advised to call PCP or come back to ER if needed.   Principal Diagnosis: Generalized weakness  Discharge Diagnoses: Active Hospital Problems   Diagnosis Date Noted   Generalized weakness 08/21/2020   Sensory disturbance 08/22/2020   Leg weakness 08/22/2020   COVID-19 virus infection 08/21/2020   Essential hypertension 06/21/2019    Resolved Hospital Problems  No resolved problems to display.    Discharge Instructions     Ambulatory referral to Physical Therapy   Complete by: As directed    Diet - low sodium heart healthy   Complete by: As directed    Increase activity slowly   Complete by: As directed       Allergies as of 08/24/2020       Reactions   Lisinopril Swelling   Lips swell        Medication List     TAKE these medications    amLODipine 10 MG tablet Commonly known as: NORVASC Take 1 tablet (10 mg total) by mouth daily.   carvedilol 6.25 MG tablet Commonly known as: COREG Take 1 tablet (6.25 mg total) by mouth 2 (two) times daily.   cetirizine 10 MG tablet Commonly known as: ZYRTEC  Take 10 mg by mouth daily.   chlorthalidone 25 MG tablet Commonly known as: HYGROTON TAKE 1/2 TABLET BY MOUTH DAILY (REPLACES HYDROCHLOROTHIAZIDE) What changed:  how much to take how to take this when to take this   multivitamin with minerals tablet Take 1 tablet by mouth daily.   OMEGA-3 2100 PO Take 1 capsule by mouth daily.   omeprazole 20 MG capsule Commonly known as: PRILOSEC TAKE 1 CAPSULE BY MOUTH ONCE A DAY   spironolactone 25 MG tablet Commonly known as: ALDACTONE Take 1 tablet (25 mg total) by  mouth daily.   valsartan 320 MG tablet Commonly known as: DIOVAN Take 1 tablet (320 mg total) by mouth daily.        Follow-up Information     [facility]. Call.   Why: Referral made for outpatient PT. Please call and arranged appointment time Contact information: [facility] at [facility] [address] [place],  [place]  [address] Get Driving Directions Main: [phone]               Allergies  Allergen Reactions   Lisinopril Swelling    Lips swell    Consultations: Neurology  Discharge Exam: BP 135/79 (BP Location: Right Arm)   Pulse 66   Temp 98.3 F (36.8 C) (Oral)   Resp 17   Ht  (1.905 m)   Wt 108.9 kg   SpO2 98%   BMI 30.00 kg/m  General appearance: alert, cooperative, and no distress Head: Normocephalic, without obvious abnormality, atraumatic Eyes:  EOMI Lungs: clear to auscultation bilaterally Heart: regular rate and rhythm and S1, S2 normal Abdomen: normal findings: bowel sounds normal and soft, non-tender Extremities:  No edema Skin: mobility and turgor normal Neurologic: Gait stable.  Minimal left-sided weakness appreciated mostly in upper extremity.  No sensory deficits.  The results of significant diagnostics from this hospitalization (including imaging, microbiology, ancillary and laboratory) are listed below for reference.   Microbiology: Recent Results (from the past 240 hour(s))  CSF culture     Status: None   Collection Time: 08/21/20  3:20 PM   Specimen: Back; Cerebrospinal Fluid  Result Value Ref Range Status   Specimen Description   Final    BACK Performed at [facility], [address] [place], [place] [address]    Special Requests   Final    NONE Performed at [facility], [address], [place], [place] [address]    Gram Stain   Final    WBC PRESENT,BOTH PMN AND MONONUCLEAR NO ORGANISMS SEEN CYTOSPIN SMEAR    Culture   Final    NO GROWTH 3  DAYS Performed at [facility], [address] [place], [place] [address]    Report Status 08/25/2020 FINAL  Final  Gram stain     Status: None   Collection Time: 08/21/20  3:20 PM   Specimen: CSF; Cerebrospinal Fluid  Result Value Ref Range Status   Specimen Description CSF  Final   Special Requests NONE  Final   Gram Stain   Final    NO ORGANISMS SEEN RARE WBC SEEN CRITICAL RESULT CALLED TO, READ BACK BY AND VERIFIED WITH: TURNER,C 1924 08/21/2020 COLEMAN,R Performed at [facility], [address], [place], [place] [address]    Report Status 08/21/2020 FINAL  Final  Resp Panel by RT-PCR (Flu A&B, Covid) Nasopharyngeal Swab     Status: Abnormal   Collection Time: 08/21/20  5:05 PM  Specimen: Nasopharyngeal Swab; Nasopharyngeal(NP) swabs in vial transport medium  Result Value Ref Range Status   SARS Coronavirus 2 by RT PCR POSITIVE (A) NEGATIVE Final    Comment: RESULT CALLED TO, READ BACK BY AND VERIFIED WITH: MARTIN,D @ 1800 ON 08/21/20 BY JUW (NOTE) SARS-CoV-2 target nucleic acids are DETECTED.  The SARS-CoV-2 RNA is generally detectable in upper respiratory specimens during the acute phase of infection. Positive results are indicative of the presence of the identified virus, but do not rule out bacterial infection or co-infection with other pathogens not detected by the test. Clinical correlation with patient history and other diagnostic information is necessary to determine patient infection status. The expected result is Negative.  Fact Sheet for Patients: BloggerCourse.com  Fact Sheet for Healthcare Providers: SeriousBroker.it  This test is not yet approved or cleared by the [place] FDA and  has been authorized for detection and/or diagnosis of SARS-CoV-2 by FDA under an Emergency Use Authorization (EUA).  This EUA will remain in effect (meaning this test can be  used) for the duration of  the COVID-19  declaration under Section 564(b)(1) of the Act, 21 U.S.C. section 360bbb-3(b)(1), unless the authorization is terminated or revoked sooner.     Influenza A by PCR NEGATIVE NEGATIVE Final   Influenza B by PCR NEGATIVE NEGATIVE Final    Comment: (NOTE) The Xpert Xpress SARS-CoV-2/FLU/RSV plus assay is intended as an aid in the diagnosis of influenza from Nasopharyngeal swab specimens and should not be used as a sole basis for treatment. Nasal washings and aspirates are unacceptable for Xpert Xpress SARS-CoV-2/FLU/RSV testing.  Fact Sheet for Patients: BloggerCourse.com  Fact Sheet for Healthcare Providers: SeriousBroker.it  This test is not yet approved or cleared by the [place] FDA and has been authorized for detection and/or diagnosis of SARS-CoV-2 by FDA under an Emergency Use Authorization (EUA). This EUA will remain in effect (meaning this test can be used) for the duration of the COVID-19 declaration under Section 564(b)(1) of the Act, 21 U.S.C. section 360bbb-3(b)(1), unless the authorization is terminated or revoked.  Performed at [facility], [address] [place], [place] [address]      Labs: BNP (last 3 results) No results for input(s): BNP in the last 8760 hours. Basic Metabolic Panel: Recent Labs  Lab 08/21/20 1027 08/22/20 0435 08/22/20 0746 08/23/20 0232 08/24/20 0159  NA 135 138  --  138 135  K 3.1* 3.6  --  3.9 3.7  CL 99 103  --  102 101  CO2 26 28  --  28 27  GLUCOSE 108* 111*  --  102* 98  BUN 12 11  --  11 12  CREATININE 0.70 0.71  --  0.72 0.73  CALCIUM 8.5* 8.7*  --  9.5 9.4  MG  --   --  2.0  --   --    Liver Function Tests: Recent Labs  Lab 08/21/20 1027 08/23/20 0232 08/24/20 0159  AST 38 43* 37  ALT 37 53* 54*  ALKPHOS 56 41 49  BILITOT 0.4 0.6 0.6  PROT 7.5 6.7 6.3*  ALBUMIN 4.1 3.8 3.7   No results for input(s): LIPASE, AMYLASE in the last 168 hours. No results for  input(s): AMMONIA in the last 168 hours. CBC: Recent Labs  Lab 08/21/20 1027 08/22/20 0435 08/23/20 0232 08/24/20 0159  WBC 6.0 4.0 5.7 5.2  NEUTROABS 3.0  --   --   --   HGB 16.3 14.3 14.5 14.4  HCT 43.8  38.8* 39.5 39.5  MCV 89.4 90.9 88.8 88.6  PLT 162 152 161 186   Cardiac Enzymes: Recent Labs  Lab 08/22/20 0746 08/23/20 1042  CKTOTAL 881* 485*   BNP: Invalid input(s): POCBNP CBG: No results for input(s): GLUCAP in the last 168 hours. D-Dimer No results for input(s): DDIMER in the last 72 hours. Hgb A1c No results for input(s): HGBA1C in the last 72 hours. Lipid Profile No results for input(s): CHOL, HDL, LDLCALC, TRIG, CHOLHDL, LDLDIRECT in the last 72 hours. Thyroid function studies No results for input(s): TSH, T4TOTAL, T3FREE, THYROIDAB in the last 72 hours.  Invalid input(s): FREET3 Anemia work up Recent Labs    08/23/20 0700  VITAMINB12 305   Urinalysis    Component Value Date/Time   COLORURINE YELLOW 08/22/2020 0727   APPEARANCEUR CLEAR 08/22/2020 0727   LABSPEC 1.010 08/22/2020 0727   PHURINE 7.0 08/22/2020 0727   GLUCOSEU NEGATIVE 08/22/2020 0727   HGBUR NEGATIVE 08/22/2020 0727   BILIRUBINUR NEGATIVE 08/22/2020 0727   KETONESUR NEGATIVE 08/22/2020 0727   PROTEINUR NEGATIVE 08/22/2020 0727   NITRITE NEGATIVE 08/22/2020 0727   LEUKOCYTESUR NEGATIVE 08/22/2020 0727   Sepsis Labs Invalid input(s): PROCALCITONIN,  WBC,  LACTICIDVEN Microbiology Recent Results (from the past 240 hour(s))  CSF culture     Status: None   Collection Time: 08/21/20  3:20 PM   Specimen: Back; Cerebrospinal Fluid  Result Value Ref Range Status   Specimen Description   Final    BACK Performed at [facility], [address] [place], [place] [address]    Special Requests   Final    NONE Performed at [facility], [address] [place], [place] [address]    Gram Stain   Final    WBC PRESENT,BOTH PMN AND MONONUCLEAR NO ORGANISMS SEEN CYTOSPIN SMEAR     Culture   Final    NO GROWTH 3 DAYS Performed at [facility], [address], [place], [place] [address]    Report Status 08/25/2020 FINAL  Final  Gram stain     Status: None   Collection Time: 08/21/20  3:20 PM   Specimen: CSF; Cerebrospinal Fluid  Result Value Ref Range Status   Specimen Description CSF  Final   Special Requests NONE  Final   Gram Stain   Final    NO ORGANISMS SEEN RARE WBC SEEN CRITICAL RESULT CALLED TO, READ BACK BY AND VERIFIED WITH: TURNER,C 1924 08/21/2020 COLEMAN,R Performed at [facility], [address], [place], [place] [address]    Report Status 08/21/2020 FINAL  Final  Resp Panel by RT-PCR (Flu A&B, Covid) Nasopharyngeal Swab     Status: Abnormal   Collection Time: 08/21/20  5:05 PM   Specimen: Nasopharyngeal Swab; Nasopharyngeal(NP) swabs in vial transport medium  Result Value Ref Range Status   SARS Coronavirus 2 by RT PCR POSITIVE (A) NEGATIVE Final    Comment: RESULT CALLED TO, READ BACK BY AND VERIFIED WITH: MARTIN,D @ 1800 ON 08/21/20 BY JUW (NOTE) SARS-CoV-2 target nucleic acids are DETECTED.  The SARS-CoV-2 RNA is generally detectable in upper respiratory specimens during the acute phase of infection. Positive results are indicative of the presence of the identified virus, but do not rule out bacterial infection or co-infection with other pathogens not detected by the test. Clinical correlation with patient history and other diagnostic information is necessary to determine patient infection status. The expected result is Negative.  Fact Sheet for Patients: BloggerCourse.com  Fact Sheet for Healthcare Providers: SeriousBroker.it  This test is not yet approved or cleared by the [place] and  has been authorized for detection and/or diagnosis of SARS-CoV-2 by FDA under an Emergency Use Authorization (EUA).  This EUA will remain in effect (meaning this test can be  used) for  the duration of  the COVID-19 declaration under Section 564(b)(1) of the Act, 21 U.S.C. section 360bbb-3(b)(1), unless the authorization is terminated or revoked sooner.     Influenza A by PCR NEGATIVE NEGATIVE Final   Influenza B by PCR NEGATIVE NEGATIVE Final    Comment: (NOTE) The Xpert Xpress SARS-CoV-2/FLU/RSV plus assay is intended as an aid in the diagnosis of influenza from Nasopharyngeal swab specimens and should not be used as a sole basis for treatment. Nasal washings and aspirates are unacceptable for Xpert Xpress SARS-CoV-2/FLU/RSV testing.  Fact Sheet for Patients: BloggerCourse.com  Fact Sheet for Healthcare Providers: SeriousBroker.it  This test is not yet approved or cleared by the [place] FDA and has been authorized for detection and/or diagnosis of SARS-CoV-2 by FDA under an Emergency Use Authorization (EUA). This EUA will remain in effect (meaning this test can be used) for the duration of the COVID-19 declaration under Section 564(b)(1) of the Act, 21 U.S.C. section 360bbb-3(b)(1), unless the authorization is terminated or revoked.  Performed at [facility], [address], [place], [place] [address]     Procedures/Studies: CT Head Wo Contrast  Result Date: 08/21/2020 CLINICAL DATA:  Numbness or tingling. Paresthesias. Patient tested positive for COVID-19 three days ago. EXAM: CT HEAD WITHOUT CONTRAST TECHNIQUE: Contiguous axial images were obtained from the base of the skull through the vertex without intravenous contrast. COMPARISON:  March 01, 2015 FINDINGS: Brain: No evidence of acute infarction, hemorrhage, hydrocephalus, extra-axial collection or mass lesion/mass effect. Vascular: No hyperdense vessel or unexpected calcification. Skull: Normal. Negative for fracture or focal lesion. Sinuses/Orbits: No acute finding. Other: None. IMPRESSION: Normal brain.  No cause for symptoms identified.  Electronically Signed   By: Gerome Sam III M.D   On: 08/21/2020 12:20   MR BRAIN W WO CONTRAST  Result Date: 08/23/2020 CLINICAL DATA:  Progressive weakness EXAM: MRI HEAD WITHOUT AND WITH CONTRAST TECHNIQUE: Multiplanar, multiecho pulse sequences of the brain and surrounding structures were obtained without and with intravenous contrast. CONTRAST:  10mL GADAVIST GADOBUTROL 1 MMOL/ML IV SOLN COMPARISON:  None. FINDINGS: Brain: No acute infarct, mass effect or extra-axial collection. No acute or chronic hemorrhage. Normal white matter signal, parenchymal volume and CSF spaces. The midline structures are normal. There is no abnormal contrast enhancement. Vascular: Major flow voids are preserved. Skull and upper cervical spine: Normal calvarium and skull base. Visualized upper cervical spine and soft tissues are normal. Sinuses/Orbits:No paranasal sinus fluid levels or advanced mucosal thickening. No mastoid or middle ear effusion. Normal orbits. IMPRESSION: Normal brain MRI. Electronically Signed   By: Deatra Robinson M.D.   On: 08/23/2020 22:17   MR CERVICAL SPINE W WO CONTRAST  Result Date: 08/23/2020 CLINICAL DATA:  Progressive weakness EXAM: MRI CERVICAL SPINE WITHOUT AND WITH CONTRAST TECHNIQUE: Multiplanar and multiecho pulse sequences of the cervical spine, to include the craniocervical junction and cervicothoracic junction, were obtained without and with intravenous contrast. CONTRAST:  10mL GADAVIST GADOBUTROL 1 MMOL/ML IV SOLN COMPARISON:  None. FINDINGS: Alignment: Physiologic. Vertebrae: No fracture, evidence of discitis, or bone lesion. Cord: Normal signal and morphology. Posterior Fossa, vertebral arteries, paraspinal tissues: Negative. Disc levels: Small right uncovertebral osteophyte at C3-4 with mild foraminal narrowing IMPRESSION: 1. No acute  abnormality of the cervical spine. 2. Mild right C3-4 neural foraminal narrowing due to uncovertebral osteophyte. Electronically Signed   By: Deatra RobinsonKevin   Herman M.D.   On: 08/23/2020 22:20     Time coordinating discharge: Over 30 minutes    Lewie Chamberavid Woodroe Vogan, MD  [facility] 08/25/2020, 5:52 PM

## 2020-08-26 LAB — VITAMIN B1: Vitamin B1 (Thiamine): 137.6 nmol/L (ref 66.5–200.0)

## 2020-08-26 LAB — VITAMIN E
Vitamin E (Alpha Tocopherol): 15.2 mg/L (ref 5.9–19.4)
Vitamin E(Gamma Tocopherol): 2.6 mg/L (ref 0.7–4.9)

## 2020-08-30 LAB — VITAMIN B1: Vitamin B1 (Thiamine): 132.4 nmol/L (ref 66.5–200.0)

## 2020-10-05 ENCOUNTER — Other Ambulatory Visit: Payer: Self-pay | Admitting: Cardiology

## 2020-10-21 NOTE — Progress Notes (Signed)
Cardiology Office Note  Date: 10/22/2020   ID: Benjamin Chang, DOB 09-02-1995, MRN [id number]  PCP:  Selinda Flavin, MD  Cardiologist:  Nona Dell, MD Electrophysiologist:  None   Chief Complaint  Patient presents with   Cardiac follow-up    History of Present Illness: Benjamin Chang is a 25 y.o. male last seen in February.  He presents for a follow-up visit.  Reports no exertional limitations, no chest pain or unusual breathlessness.  Continues to work on his farm, also for [employer].  He does report intermittent stress and headaches.  States that he checks his blood pressure at home occasionally, typically better controlled than today.  I rechecked his blood pressure in his right arm finding it to be 148/82.  He states that he is careful watching salt in his diet.  I reviewed his medications which are outlined below.  He did need refills for valsartan, amlodipine, and omeprazole.  Still follows at [facility], I asked him to make sure that he has a smooth transition as [name] completely retires, he states that he still sees him occasionally.  Past Medical History:  Diagnosis Date   Atypical chest pain    Essential hypertension    GERD (gastroesophageal reflux disease)     Past Surgical History:  Procedure Laterality Date   LAPAROSCOPIC APPENDECTOMY N/A 11/04/2015   Procedure: APPENDECTOMY LAPAROSCOPIC;  Surgeon: Ancil Linsey, MD;  Location: [facility];  Service: General;  Laterality: N/A;   TONSILLECTOMY      Current Outpatient Medications  Medication Sig Dispense Refill   carvedilol (COREG) 6.25 MG tablet Take 1 tablet (6.25 mg total) by mouth 2 (two) times daily. 180 tablet 3   cetirizine (ZYRTEC) 10 MG tablet Take 10 mg by mouth daily.     chlorthalidone (HYGROTON) 25 MG tablet TAKE 1/2 TABLET BY MOUTH DAILY (REPLACES HYDROCHLOROTHIAZIDE) 45 tablet 3   Multiple Vitamins-Minerals (MULTIVITAMIN WITH MINERALS) tablet Take 1 tablet by mouth daily.      Omega-3 Fatty Acids (OMEGA-3 2100 PO) Take 1 capsule by mouth daily.     spironolactone (ALDACTONE) 25 MG tablet Take 1 tablet (25 mg total) by mouth daily. 90 tablet 3   amLODipine (NORVASC) 10 MG tablet Take 1 tablet (10 mg total) by mouth daily. 90 tablet 3   omeprazole (PRILOSEC) 20 MG capsule TAKE 1 CAPSULE BY MOUTH ONCE A DAY 90 capsule 3   valsartan (DIOVAN) 320 MG tablet Take 1 tablet (320 mg total) by mouth daily. 90 tablet 3   No current facility-administered medications for this visit.   Allergies:  Lisinopril   ROS: No palpitations or syncope.  Physical Exam: VS:  BP (!) 152/98   Pulse 77   Ht 6\' 3"  (1.905 m)   Wt 238 lb (108 kg)   SpO2 96%   BMI 29.75 kg/m , BMI Body mass index is 29.75 kg/m.  Wt Readings from Last 3 Encounters:  10/22/20 238 lb (108 kg)  08/21/20 240 lb (108.9 kg)  08/21/20 240 lb (108.9 kg)    General: Patient appears comfortable at rest. HEENT: Conjunctiva and lids normal, wearing a mask. Neck: Supple, no elevated JVP or carotid bruits, no thyromegaly. Lungs: Clear to auscultation, nonlabored breathing at rest. Cardiac: Regular rate and rhythm, no S3 or significant systolic murmur, no pericardial rub. Extremities: No pitting edema.  ECG:  An ECG dated 04/02/2020 was personally reviewed today and demonstrated:  Sinus rhythm.  Recent Labwork: 08/21/2020: TSH 2.963 08/22/2020:  Magnesium 2.0 08/24/2020: ALT 54; AST 37; BUN 12; Creatinine, Ser 0.73; Hemoglobin 14.4; Platelets 186; Potassium 3.7; Sodium 135   Other Studies Reviewed Today:  Echocardiogram 10/31/2016: - Left ventricle: The cavity size was normal. Wall thickness was    increased increased in a pattern of mild to moderate LVH.    Systolic function was normal. The estimated ejection fraction was    in the range of 60% to 65%. Wall motion was normal; there were no    regional wall motion abnormalities. Left ventricular diastolic    function parameters were normal.  - Aortic  valve: Valve area (VTI): 2.7 cm^2. Valve area (Vmax): 3.26    cm^2.  - Technically adequate study.  Assessment and Plan:  Essential hypertension on multimodal therapy.  Previous work-up was negative for secondary causes and he was previously evaluated in the hypertension clinic in [place].  Reinforced sodium restriction, also talked about regular exercise and stress reduction today.  Continue current regimen including Norvasc, Diovan, chlorthalidone, Aldactone, and Coreg.  Keep follow-up with [facility].  Medication Adjustments/Labs and Tests Ordered: Current medicines are reviewed at length with the patient today.  Concerns regarding medicines are outlined above.   Tests Ordered: No orders of the defined types were placed in this encounter.   Medication Changes: Meds ordered this encounter  Medications   amLODipine (NORVASC) 10 MG tablet    Sig: Take 1 tablet (10 mg total) by mouth daily.    Dispense:  90 tablet    Refill:  3   omeprazole (PRILOSEC) 20 MG capsule    Sig: TAKE 1 CAPSULE BY MOUTH ONCE A DAY    Dispense:  90 capsule    Refill:  3   valsartan (DIOVAN) 320 MG tablet    Sig: Take 1 tablet (320 mg total) by mouth daily.    Dispense:  90 tablet    Refill:  3     Disposition:  Follow up  6 months.  Signed, Jonelle Sidle, MD, [facility] 10/22/2020 2:25 PM    [facility] at [facility] [address] [place], [place] [address] Phone: [phone]; Fax: [phone]

## 2020-10-22 ENCOUNTER — Other Ambulatory Visit: Payer: Self-pay

## 2020-10-22 ENCOUNTER — Encounter: Payer: Self-pay | Admitting: Cardiology

## 2020-10-22 ENCOUNTER — Ambulatory Visit: Payer: Commercial Managed Care - PPO | Admitting: Cardiology

## 2020-10-22 VITALS — BP 152/98 | HR 77 | Ht 75.0 in | Wt 238.0 lb

## 2020-10-22 DIAGNOSIS — I10 Essential (primary) hypertension: Secondary | ICD-10-CM

## 2020-10-22 MED ORDER — OMEPRAZOLE 20 MG PO CPDR
DELAYED_RELEASE_CAPSULE | Freq: Every day | ORAL | 3 refills | Status: DC
Start: 2020-10-22 — End: 2021-05-13

## 2020-10-22 MED ORDER — AMLODIPINE BESYLATE 10 MG PO TABS: 10.0000 mg | ORAL_TABLET | Freq: Every day | ORAL | 3 refills | Status: DC

## 2020-10-22 MED ORDER — VALSARTAN 320 MG PO TABS: 320.0000 mg | ORAL_TABLET | Freq: Every day | ORAL | 3 refills | Status: DC

## 2020-10-22 NOTE — Patient Instructions (Signed)

## 2021-04-06 ENCOUNTER — Telehealth: Payer: Self-pay | Admitting: Cardiology

## 2021-04-06 MED ORDER — CHLORTHALIDONE 25 MG PO TABS: ORAL_TABLET | ORAL | 3 refills | Status: DC

## 2021-04-06 MED ORDER — CARVEDILOL 6.25 MG PO TABS: 6.2500 mg | ORAL_TABLET | Freq: Two times a day (BID) | ORAL | 3 refills | Status: DC

## 2021-04-06 MED ORDER — SPIRONOLACTONE 25 MG PO TABS: 25.0000 mg | ORAL_TABLET | Freq: Every day | ORAL | 3 refills | Status: DC

## 2021-04-06 NOTE — Telephone Encounter (Signed)
?*  STAT* If patient is at the pharmacy, call can be transferred to refill team. ? ? ?1. Which medications need to be refilled? (please list name of each medication and dose if known) need new prescriptions for  Carvedilol,  Spironolacione and Chlorthalidone ? ?2. Which pharmacy/location (including street and city if local pharmacy) is medication to be sent to? [facility] [address] [place],  ? ?3. Do they need a 30 day or 90 day supply? 30 days and refills ? ?

## 2021-04-06 NOTE — Telephone Encounter (Signed)
Refill complete 

## 2021-04-26 ENCOUNTER — Ambulatory Visit: Payer: Commercial Managed Care - PPO | Admitting: Cardiology

## 2021-05-13 ENCOUNTER — Other Ambulatory Visit (HOSPITAL_COMMUNITY)
Admission: RE | Admit: 2021-05-13 | Discharge: 2021-05-13 | Disposition: A | Discharge status: Home / Self Care | Payer: Commercial Managed Care - PPO | Source: Ambulatory Visit | Attending: Student | Admitting: Student

## 2021-05-13 ENCOUNTER — Ambulatory Visit: Payer: Commercial Managed Care - PPO | Admitting: Student

## 2021-05-13 ENCOUNTER — Encounter: Payer: Self-pay | Admitting: Student

## 2021-05-13 VITALS — BP 138/68 | HR 89 | Ht 75.0 in | Wt 240.6 lb

## 2021-05-13 DIAGNOSIS — Z79899 Other long term (current) drug therapy: Secondary | ICD-10-CM | POA: Diagnosis present

## 2021-05-13 DIAGNOSIS — R079 Chest pain, unspecified: Secondary | ICD-10-CM

## 2021-05-13 DIAGNOSIS — I10 Essential (primary) hypertension: Secondary | ICD-10-CM | POA: Insufficient documentation

## 2021-05-13 DIAGNOSIS — K219 Gastro-esophageal reflux disease without esophagitis: Secondary | ICD-10-CM

## 2021-05-13 LAB — CBC
HCT: 40.8 % (ref 39.0–52.0)
Hemoglobin: 15.1 g/dL (ref 13.0–17.0)
MCH: 33.3 pg (ref 26.0–34.0)
MCHC: 37 g/dL — ABNORMAL HIGH (ref 30.0–36.0)
MCV: 89.9 fL (ref 80.0–100.0)
Platelets: 259 10*3/uL (ref 150–400)
RBC: 4.54 MIL/uL (ref 4.22–5.81)
RDW: 11.9 % (ref 11.5–15.5)
WBC: 7.4 10*3/uL (ref 4.0–10.5)
nRBC: 0 % (ref 0.0–0.2)

## 2021-05-13 LAB — BASIC METABOLIC PANEL
Anion gap: 8 (ref 5–15)
BUN: 17 mg/dL (ref 6–20)
CO2: 24 mmol/L (ref 22–32)
Calcium: 9.1 mg/dL (ref 8.9–10.3)
Chloride: 104 mmol/L (ref 98–111)
Creatinine, Ser: 0.98 mg/dL (ref 0.61–1.24)
GFR, Estimated: >60 mL/min (ref >60)
Glucose, Bld: 110 mg/dL — ABNORMAL HIGH (ref 70–99)
Potassium: 3.7 mmol/L (ref 3.5–5.1)
Sodium: 136 mmol/L (ref 135–145)

## 2021-05-13 MED ORDER — CARVEDILOL 6.25 MG PO TABS: 6.2500 mg | ORAL_TABLET | Freq: Two times a day (BID) | ORAL | 3 refills | Status: DC

## 2021-05-13 MED ORDER — OMEPRAZOLE 20 MG PO CPDR: DELAYED_RELEASE_CAPSULE | Freq: Every day | ORAL | 3 refills | Status: DC

## 2021-05-13 MED ORDER — SPIRONOLACTONE 25 MG PO TABS: 25.0000 mg | ORAL_TABLET | Freq: Every day | ORAL | 3 refills | Status: DC

## 2021-05-13 MED ORDER — CHLORTHALIDONE 25 MG PO TABS: ORAL_TABLET | ORAL | 3 refills | Status: DC

## 2021-05-13 NOTE — Patient Instructions (Signed)
Medication Instructions:  ?Your physician recommends that you continue on your current medications as directed. Please refer to the Current Medication list given to you today. ? ?*If you need a refill on your cardiac medications before your next appointment, please call your pharmacy* ? ? ?Lab Work: ?Your physician recommends that you return for lab work in: Today ([place], BMET)  ? ?If you have labs (blood work) drawn today and your tests are completely normal, you will receive your results only by: ?MyChart Message (if you have MyChart) OR ?A paper copy in the mail ?If you have any lab test that is abnormal or we need to change your treatment, we will call you to review the results. ? ? ?Testing/Procedures: ?NONE  ? ? ?Follow-Up: ?At [facility], you and your health needs are our priority.  As part of our continuing mission to provide you with exceptional heart care, we have created designated Provider Care Teams.  These Care Teams include your primary Cardiologist (physician) and Advanced Practice Providers (APPs -  Physician Assistants and Nurse Practitioners) who all work together to provide you with the care you need, when you need it. ? ?We recommend signing up for the patient portal called "MyChart".  Sign up information is provided on this After Visit Summary.  MyChart is used to connect with patients for Virtual Visits (Telemedicine).  Patients are able to view lab/test results, encounter notes, upcoming appointments, etc.  Non-urgent messages can be sent to your provider as well.   ?To learn more about what you can do with MyChart, go to [email or web address].   ? ?Your next appointment:   ?6 month(s) ? ?The format for your next appointment:   ?In Person ? ?Provider:   ?Carlyle Dolly, MD  ? ? ?Other Instructions ?Thank you for choosing [facility]! ? ? ? ?
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
## 2021-11-01 ENCOUNTER — Encounter: Payer: Self-pay | Admitting: Cardiology

## 2021-11-01 ENCOUNTER — Ambulatory Visit: Payer: Commercial Managed Care - PPO | Attending: Cardiology | Admitting: Cardiology

## 2021-11-01 VITALS — BP 140/82 | HR 81 | Ht 74.0 in | Wt 239.0 lb

## 2021-11-01 DIAGNOSIS — I10 Essential (primary) hypertension: Secondary | ICD-10-CM | POA: Diagnosis not present

## 2021-11-01 NOTE — Progress Notes (Signed)
Cardiology Office Note  Date: 11/01/2021   ID: DVAUGHN Chang, DOB [birth year], MRN [id number]  PCP:  [facility]  Cardiologist:  Nona Dell, MD Electrophysiologist:  None   Chief Complaint  Patient presents with   Cardiac follow-up    History of Present Illness: Benjamin Chang is a 26 y.o. male last seen in April by Ms. Strader PA-C, I reviewed the note.  He is here for a routine visit.  Reports no exertional chest pain or major functional limitations.  Still works as a Visual merchandiser, travel jobs mainly.  He was most recently in [place].  He has not had a recent visit at [facility], I recommended that he set up a routine evaluation.  We went over his medications which are noted below.  He reports compliance.  Does feel some orthostatic lightheadedness, no frank syncope.  His current home blood pressure monitor is broken, I asked him to get another one to track blood pressure periodically in case changes need to be made.  Past Medical History:  Diagnosis Date   Atypical chest pain    Essential hypertension    GERD (gastroesophageal reflux disease)     Past Surgical History:  Procedure Laterality Date   LAPAROSCOPIC APPENDECTOMY N/A 11/04/2015   Procedure: APPENDECTOMY LAPAROSCOPIC;  Surgeon: Ancil Linsey, MD;  Location: [facility];  Service: General;  Laterality: N/A;   TONSILLECTOMY      Current Outpatient Medications  Medication Sig Dispense Refill   amLODipine (NORVASC) 10 MG tablet Take 1 tablet (10 mg total) by mouth daily. 90 tablet 3   carvedilol (COREG) 6.25 MG tablet Take 1 tablet (6.25 mg total) by mouth 2 (two) times daily. 180 tablet 3   cetirizine (ZYRTEC) 10 MG tablet Take 10 mg by mouth daily.     chlorthalidone (HYGROTON) 25 MG tablet TAKE 1/2 TABLET BY MOUTH DAILY (REPLACES HYDROCHLOROTHIAZIDE) 45 tablet 3   Omega-3 Fatty Acids (OMEGA-3 2100 PO) Take 1 capsule by mouth daily.     omeprazole (PRILOSEC) 20 MG capsule TAKE 1 CAPSULE BY MOUTH  ONCE A DAY 90 capsule 3   spironolactone (ALDACTONE) 25 MG tablet Take 1 tablet (25 mg total) by mouth daily. 90 tablet 3   valsartan (DIOVAN) 320 MG tablet Take 1 tablet (320 mg total) by mouth daily. 90 tablet 3   No current facility-administered medications for this visit.   Allergies:  Lisinopril   ROS: No palpitations.  No orthopnea or PND.  Physical Exam: VS:  BP (!) 140/82   Pulse 81   Ht 6\' 2"  (1.88 m)   Wt 239 lb (108.4 kg)   SpO2 95%   BMI 30.69 kg/m , BMI Body mass index is 30.69 kg/m.  Wt Readings from Last 3 Encounters:  11/01/21 239 lb (108.4 kg)  05/13/21 240 lb 9.6 oz (109.1 kg)  10/22/20 238 lb (108 kg)    General: Patient appears comfortable at rest. HEENT: Conjunctiva and lids normal. Neck: Supple, no elevated JVP. Lungs: Clear to auscultation, nonlabored breathing at rest. Cardiac: Regular rate and rhythm, no S3 or significant systolic murmur, no pericardial rub. Abdomen: Soft, nontender, bowel sounds present. Extremities: No pitting edema.  ECG:  An ECG dated 05/13/2021 was personally reviewed today and demonstrated:  Sinus rhythm.  Recent Labwork: 05/13/2021: BUN 17; Creatinine, Ser 0.98; Hemoglobin 15.1; Platelets 259; Potassium 3.7; Sodium 136   Other Studies Reviewed Today:  Coronary CTA 06/03/2019: FINDINGS: Non-cardiac: See separate report from [facility].   Pulmonary  veins drain normally to the left atrium.   Calcium Score: 0 Agatston units.   Coronary Arteries: Right dominant with no anomalies   LM: No plaque or stenosis.   LAD system:  No plaque or stenosis.   Circumflex system: Small to moderate ramus present. No plaque or stenosis in LCx system.   RCA system: No plaque or stenosis.   IMPRESSION: 1.Coronary artery calcium score 0 Agatston units, suggesting low risk for future cardiac events.   2.  No significant coronary disease noted.  Assessment and Plan:  1.  Resistant hypertension requiring multimodal therapy.   He reports compliance with current regimen including Norvasc, Coreg, chlorthalidone, Aldactone, and Diovan.  Has noticed some orthostatic dizziness but no syncope.  Recommended that he get a new home blood pressure cuff for better monitoring in case further changes are needed.  2.  History of normal coronary CTA in 2021 with low risk calcium score of 0.  Medication Adjustments/Labs and Tests Ordered: Current medicines are reviewed at length with the patient today.  Concerns regarding medicines are outlined above.   Tests Ordered: No orders of the defined types were placed in this encounter.   Medication Changes: No orders of the defined types were placed in this encounter.   Disposition:  Follow up  6 months.  Signed, Satira Sark, MD, [facility] 11/01/2021 2:47 PM    [facility] at [facility] [address] [place], [place] [address] Phone: [phone]; Fax: [phone]

## 2021-11-01 NOTE — Patient Instructions (Signed)
Medication Instructions:  Your physician recommends that you continue on your current medications as directed. Please refer to the Current Medication list given to you today.   Labwork: None today  Testing/Procedures: None today  Follow-Up: 6 months  Any Other Special Instructions Will Be Listed Below (If Applicable).  If you need a refill on your cardiac medications before your next appointment, please call your pharmacy.  

## 2021-11-06 ENCOUNTER — Other Ambulatory Visit: Payer: Self-pay | Admitting: Cardiology

## 2022-01-03 ENCOUNTER — Other Ambulatory Visit (HOSPITAL_COMMUNITY): Payer: Self-pay

## 2022-04-10 ENCOUNTER — Encounter: Payer: Self-pay | Admitting: Physician Assistant

## 2022-04-10 ENCOUNTER — Ambulatory Visit: Payer: Commercial Managed Care - PPO | Admitting: Physician Assistant

## 2022-04-10 ENCOUNTER — Other Ambulatory Visit
Admission: RE | Admit: 2022-04-10 | Discharge: 2022-04-10 | Disposition: A | Discharge status: Home / Self Care | Payer: Commercial Managed Care - PPO | Source: Ambulatory Visit | Attending: Physician Assistant | Admitting: Physician Assistant

## 2022-04-10 VITALS — BP 128/78 | HR 85 | Ht 74.0 in | Wt 244.2 lb

## 2022-04-10 DIAGNOSIS — R079 Chest pain, unspecified: Secondary | ICD-10-CM | POA: Diagnosis present

## 2022-04-10 DIAGNOSIS — I1A0 Resistant hypertension: Secondary | ICD-10-CM

## 2022-04-10 DIAGNOSIS — Z720 Tobacco use: Secondary | ICD-10-CM | POA: Diagnosis not present

## 2022-04-10 LAB — CBC
HCT: 43.5 % (ref 39.0–52.0)
Hemoglobin: 15.7 g/dL (ref 13.0–17.0)
MCH: 31.7 pg (ref 26.0–34.0)
MCHC: 36.1 g/dL — ABNORMAL HIGH (ref 30.0–36.0)
MCV: 87.7 fL (ref 80.0–100.0)
Platelets: 245 10*3/uL (ref 150–400)
RBC: 4.96 MIL/uL (ref 4.22–5.81)
RDW: 12.2 % (ref 11.5–15.5)
WBC: 8 10*3/uL (ref 4.0–10.5)
nRBC: 0 % (ref 0.0–0.2)

## 2022-04-10 LAB — COMPREHENSIVE METABOLIC PANEL
ALT: 48 U/L — ABNORMAL HIGH (ref 0–44)
AST: 29 U/L (ref 15–41)
Albumin: 4.6 g/dL (ref 3.5–5.0)
Alkaline Phosphatase: 69 U/L (ref 38–126)
Anion gap: 9 (ref 5–15)
BUN: 12 mg/dL (ref 6–20)
CO2: 25 mmol/L (ref 22–32)
Calcium: 9.1 mg/dL (ref 8.9–10.3)
Chloride: 100 mmol/L (ref 98–111)
Creatinine, Ser: 0.84 mg/dL (ref 0.61–1.24)
GFR, Estimated: >60 mL/min (ref >60)
Glucose, Bld: 93 mg/dL (ref 70–99)
Potassium: 3.3 mmol/L — ABNORMAL LOW (ref 3.5–5.1)
Sodium: 134 mmol/L — ABNORMAL LOW (ref 135–145)
Total Bilirubin: 0.6 mg/dL (ref 0.3–1.2)
Total Protein: 8.2 g/dL — ABNORMAL HIGH (ref 6.5–8.1)

## 2022-04-10 LAB — TSH: TSH: 1.785 u[IU]/mL (ref 0.350–4.500)

## 2022-04-10 NOTE — Patient Instructions (Signed)
Medication Instructions:  Your physician recommends that you continue on your current medications as directed. Please refer to the Current Medication list given to you today.  *If you need a refill on your cardiac medications before your next appointment, please call your pharmacy*   Lab Work: Your physician recommends that you return for lab work in: Today   If you have labs (blood work) drawn today and your tests are completely normal, you will receive your results only by: MyChart Message (if you have MyChart) OR A paper copy in the mail If you have any lab test that is abnormal or we need to change your treatment, we will call you to review the results.   Testing/Procedures: NONE    Follow-Up: At [facility], you and your health needs are our priority.  As part of our continuing mission to provide you with exceptional heart care, we have created designated Provider Care Teams.  These Care Teams include your primary Cardiologist (physician) and Advanced Practice Providers (APPs -  Physician Assistants and Nurse Practitioners) who all work together to provide you with the care you need, when you need it.  We recommend signing up for the patient portal called "MyChart".  Sign up information is provided on this After Visit Summary.  MyChart is used to connect with patients for Virtual Visits (Telemedicine).  Patients are able to view lab/test results, encounter notes, upcoming appointments, etc.  Non-urgent messages can be sent to your provider as well.   To learn more about what you can do with MyChart, go to [email or web address].    Your next appointment:   1 year(s)  Provider:   You may see Rozann Lesches, MD or one of the following Advanced Practice Providers on your designated Care Team:   Bernerd Pho, PA-C  Ermalinda Barrios, PA-C     Other Instructions Thank you for choosing [facility]!

## 2022-04-10 NOTE — Progress Notes (Signed)
Cardiology Office Note:    Date:  04/10/2022   ID:  Benjamin Chang, DOB Mar 22, 1995, MRN [id number]  PCP:  [facility] Cardiologist:  Rozann Lesches, MD     Referring MD: [facility]   Chief Complaint:  Other (6 month f/u no complaints today. Meds reviewed verbally with pt.), Follow-up, and New Patient (Initial Visit)     History of Present Illness:   Benjamin Chang is a 27 y.o. male with history of resistant hypertension (work-up for secondary causes unrevealing), GERD, chest pain (Coronary CT in 05/2019 showing calcium score of 0) and tobacco use.  Patient last saw Dr. Domenic Polite 10/2021 and had some orthostatic dizziness and recommended to get a new BP cuff to monitor at home.      Patient says he gets dizzy if he gets up quickly but is better if he takes his time. Not checking his BP regularly but when he does 130/80's. Does a lot of walking and heavy lifting. 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 Most days he feels sluggish and never feels great. Still smoking 1/2 ppd. PGF MI in 39's, MGF aortic aneurysm.       Past Medical History:  Diagnosis Date   Atypical chest pain    Essential hypertension    GERD (gastroesophageal reflux disease)    Current Medications: Current Meds  Medication Sig   amLODipine (NORVASC) 10 MG tablet TAKE 1 TABLET(10 MG) BY MOUTH DAILY   carvedilol (COREG) 6.25 MG tablet Take 1 tablet (6.25 mg total) by mouth 2 (two) times daily.   cetirizine (ZYRTEC) 10 MG tablet Take 10 mg by mouth daily.   chlorthalidone (HYGROTON) 25 MG tablet TAKE 1/2 TABLET BY MOUTH DAILY (REPLACES HYDROCHLOROTHIAZIDE)   omeprazole (PRILOSEC) 20 MG capsule TAKE 1 CAPSULE BY MOUTH ONCE A DAY   spironolactone (ALDACTONE) 25 MG tablet Take 1 tablet (25 mg total) by mouth daily.   valsartan (DIOVAN) 320 MG tablet TAKE 1 TABLET(320 MG) BY MOUTH DAILY     Allergies:   Lisinopril   Social History   Tobacco Use   Smoking status: Some Days    Packs/day: 0.50    Types: Cigarettes   Smokeless tobacco: Never  Vaping Use   Vaping Use: Never used  Substance Use Topics   Alcohol use: Yes    Comment: occ   Drug use: No    Family Hx: The patient's family history includes Aneurysm in his maternal grandfather; Dementia in his paternal grandfather; Diabetes in his paternal grandfather; Heart attack in his paternal grandfather; Heart disease in his father, maternal grandmother, mother, and another family member; Hypertension in his father, maternal grandmother, mother, paternal grandfather, and paternal grandmother.  ROS     Physical Exam:    VS:  BP 128/78   Pulse 85   Ht '6\' 2"'$  (1.88 m)   Wt 244 lb 4 oz (110.8 kg)   SpO2 98%   BMI 31.36 kg/m     Wt Readings from Last 3 Encounters:  04/10/22 244 lb 4 oz (110.8 kg)  11/01/21 239 lb (108.4 kg)  05/13/21 240 lb 9.6 oz (109.1 kg)    Physical Exam  GEN: Well nourished, well developed, in no acute distress  Neck: no JVD, carotid bruits, or masses Cardiac:RRR; no murmurs, rubs, or gallops  Respiratory:  clear to auscultation bilaterally, normal work of breathing GI: soft, nontender, nondistended, + BS  Ext: without cyanosis, clubbing, or edema, Good distal pulses bilaterally Neuro:  Alert and Oriented x 3,  Psych: euthymic mood, full affect        EKGs/Labs/Other Test Reviewed:    EKG:  EKG is  not  ordered today.     Recent Labs: 05/13/2021: BUN 17; Creatinine, Ser 0.98; Hemoglobin 15.1; Platelets 259; Potassium 3.7; Sodium 136   Recent Lipid Panel No results for input(s): "CHOL", "TRIG", "HDL", "VLDL", "LDLCALC", "LDLDIRECT" in the last 8760 hours.   Prior CV Studies:   Coronary CTA 06/03/2019: FINDINGS: Non-cardiac: See separate report from [facility].   Pulmonary veins drain normally to the left atrium.   Calcium Score: 0 Agatston units.   Coronary  Arteries: Right dominant with no anomalies   LM: No plaque or stenosis.   LAD system:  No plaque or stenosis.   Circumflex system: Small to moderate ramus present. No plaque or stenosis in LCx system.   RCA system: No plaque or stenosis.   IMPRESSION: 1.Coronary artery calcium score 0 Agatston units, suggesting low risk for future cardiac events.   2.  No significant coronary disease noted.     Risk Assessment/Calculations/Metrics:              ASSESSMENT & PLAN:   No problem-specific Assessment & Plan notes found for this encounter.   Resistant HTN BP controlled on amlodipine, carvedilol, chlorthalidone, spiro and valsartan. Check labs today. Dizziness is stable if he takes his time. Not interested in HTN clinic(has seen Dr. Oval Linsey in the past)  History of chest pain with Coronary CT 05/2019 calcium score 0  Tobacco abuse. Smoking cessation discussed, he declined nicotine patches.            Dispo:  No follow-ups on file.   Medication Adjustments/Labs and Tests Ordered: Current medicines are reviewed at length with the patient today.  Concerns regarding medicines are outlined above.  Tests Ordered: Orders Placed This Encounter  Procedures   Comprehensive Metabolic Panel (CMET)   TSH   CBC   Medication Changes: No orders of the defined types were placed in this encounter.  Signed, Ermalinda Barrios, PA-C  04/10/2022 2:13 PM    [address], [place], [place]  [address] Phone: [phone]; Fax: [phone]

## 2022-04-12 ENCOUNTER — Telehealth: Payer: Self-pay

## 2022-04-12 DIAGNOSIS — Z79899 Other long term (current) drug therapy: Secondary | ICD-10-CM

## 2022-04-12 MED ORDER — POTASSIUM CHLORIDE CRYS ER 20 MEQ PO TBCR: EXTENDED_RELEASE_TABLET | ORAL | 1 refills | Status: DC

## 2022-04-12 NOTE — Telephone Encounter (Signed)
Spoke to LaCoste, Enoch per DPR,  who verbalized understanding. Patients spouse had no questions or concerns at this time and will relay results to patient. PCP copied.

## 2022-04-12 NOTE — Telephone Encounter (Signed)
-----   Message from Imogene Burn, PA-C sent at 04/11/2022  7:40 AM EST ----- Labs stable but potassium low. Kdur 20 meq 2 today then 1/2 daily. Repeat bmet in 2-3 weeks. thanks

## 2022-05-12 ENCOUNTER — Other Ambulatory Visit (HOSPITAL_COMMUNITY)
Admission: RE | Admit: 2022-05-12 | Discharge: 2022-05-12 | Disposition: A | Discharge status: Home / Self Care | Payer: Commercial Managed Care - PPO | Source: Ambulatory Visit | Attending: Physician Assistant | Admitting: Physician Assistant

## 2022-05-12 DIAGNOSIS — Z79899 Other long term (current) drug therapy: Secondary | ICD-10-CM | POA: Insufficient documentation

## 2022-05-12 LAB — BASIC METABOLIC PANEL
Anion gap: 10 (ref 5–15)
BUN: 16 mg/dL (ref 6–20)
CO2: 24 mmol/L (ref 22–32)
Calcium: 9.1 mg/dL (ref 8.9–10.3)
Chloride: 101 mmol/L (ref 98–111)
Creatinine, Ser: 0.86 mg/dL (ref 0.61–1.24)
GFR, Estimated: >60 mL/min (ref >60)
Glucose, Bld: 131 mg/dL — ABNORMAL HIGH (ref 70–99)
Potassium: 3.1 mmol/L — ABNORMAL LOW (ref 3.5–5.1)
Sodium: 135 mmol/L (ref 135–145)

## 2022-05-15 ENCOUNTER — Telehealth: Payer: Self-pay

## 2022-05-15 ENCOUNTER — Other Ambulatory Visit (HOSPITAL_COMMUNITY): Payer: Self-pay

## 2022-05-15 DIAGNOSIS — Z79899 Other long term (current) drug therapy: Secondary | ICD-10-CM

## 2022-05-15 DIAGNOSIS — E876 Hypokalemia: Secondary | ICD-10-CM

## 2022-05-15 MED ORDER — POTASSIUM CHLORIDE CRYS ER 20 MEQ PO TBCR
20.0000 meq | EXTENDED_RELEASE_TABLET | Freq: Every day | ORAL | 3 refills | Status: DC
  Filled 2022-05-15: qty 90, 90d supply, fill #0

## 2022-05-15 NOTE — Telephone Encounter (Signed)
Patient notified and verbalized understanding. Patient stated that he is currently taking medications as prescribed. Pt stated he will increase medication per providers recommendations and will have lab work in 7-10 days at [facility]. Patient had no questions or concerns at this time. PCP copied.

## 2022-05-15 NOTE — Telephone Encounter (Signed)
-----   Message from Lendon Ka, RN sent at 05/12/2022  5:19 PM EDT -----  ----- Message ----- From: Ellsworth Lennox, PA-C Sent: 05/12/2022  11:05 AM EDT To: Lendon Ka, RN  Labs reviewed and K+ is still low at 3.1 despite him being on K+ supplementation. Please confirm that he is still taking K-dur 10 mEq daily. Would also confirm he is still taking Spironolactone 25mg  daily as this helps with K+ levels as well. If taking both, would have him take a total of 40 mEq today and then daily starting tomorrow. Repeat BMET in 7-10 days for reassessment (association being med management and hypokalemia).

## 2022-05-16 ENCOUNTER — Telehealth: Payer: Self-pay | Admitting: Student

## 2022-05-16 MED ORDER — CHLORTHALIDONE 25 MG PO TABS
ORAL_TABLET | ORAL | 3 refills | Status: DC
Start: 2022-05-16 — End: 2023-06-15

## 2022-05-16 MED ORDER — POTASSIUM CHLORIDE CRYS ER 20 MEQ PO TBCR: 20.0000 meq | EXTENDED_RELEASE_TABLET | Freq: Every day | ORAL | 3 refills | Status: DC

## 2022-05-16 MED ORDER — CARVEDILOL 6.25 MG PO TABS: 6.2500 mg | ORAL_TABLET | Freq: Two times a day (BID) | ORAL | 3 refills | Status: DC

## 2022-05-16 MED ORDER — OMEPRAZOLE 20 MG PO CPDR: DELAYED_RELEASE_CAPSULE | Freq: Every day | ORAL | 3 refills | Status: DC

## 2022-05-16 MED ORDER — SPIRONOLACTONE 25 MG PO TABS
25.0000 mg | ORAL_TABLET | Freq: Every day | ORAL | 3 refills | Status: DC
Start: 2022-05-16 — End: 2023-06-15

## 2022-05-16 NOTE — Telephone Encounter (Signed)
Patients spouse notified and verbalized understanding.  

## 2022-05-16 NOTE — Telephone Encounter (Signed)
Spoke to patients wife who was informed that patient should be taking K supplement 20 meq po qd along with Cleda Daub 25 mg tablets qd. Pt's wife verbalized understanding. Pt's wife stated that pt is currently working out of town and will not be back until 4/19 and asked if it was fine for pt to have lab work that day since pt did not start new dosing of k until 4/8.   Please advise.

## 2022-05-16 NOTE — Telephone Encounter (Signed)
Patient's wife called wanting to know if it would be okay for patient to continue taking his potassium until he goes for his labs on 4/19.  She states patient started taking the potassium on 05/15/22. Patient is currently working out of town and will not be in town to get labs done this Friday, 05/19/22 as he was advised to do.   She would also like for the [facility] and [employer] to be removed as patient only uses Development worker, community.

## 2022-05-16 NOTE — Telephone Encounter (Signed)
Per result note- B. Strader, PA-C:  Labs reviewed and K+ is still low at 3.1 despite him being on K+ supplementation. Please confirm that he is still taking K-dur 10 mEq daily. Would also confirm he is still taking Spironolactone 25mg  daily as this helps with K+ levels as well. If taking both, would have him take a total of 40 mEq today and then daily starting tomorrow. Repeat BMET in 7-10 days for reassessment (association being med management and hypokalemia).

## 2022-05-26 ENCOUNTER — Other Ambulatory Visit (HOSPITAL_COMMUNITY)
Admission: RE | Admit: 2022-05-26 | Discharge: 2022-05-26 | Disposition: A | Discharge status: Home / Self Care | Payer: Commercial Managed Care - PPO | Source: Ambulatory Visit | Attending: Student | Admitting: Student

## 2022-05-26 DIAGNOSIS — E876 Hypokalemia: Secondary | ICD-10-CM

## 2022-05-26 DIAGNOSIS — Z79899 Other long term (current) drug therapy: Secondary | ICD-10-CM | POA: Diagnosis present

## 2022-05-26 LAB — BASIC METABOLIC PANEL
Anion gap: 9 (ref 5–15)
BUN: 15 mg/dL (ref 6–20)
CO2: 27 mmol/L (ref 22–32)
Calcium: 9.1 mg/dL (ref 8.9–10.3)
Chloride: 100 mmol/L (ref 98–111)
Creatinine, Ser: 0.93 mg/dL (ref 0.61–1.24)
GFR, Estimated: >60 mL/min (ref >60)
Glucose, Bld: 111 mg/dL — ABNORMAL HIGH (ref 70–99)
Potassium: 3.5 mmol/L (ref 3.5–5.1)
Sodium: 136 mmol/L (ref 135–145)

## 2022-11-07 ENCOUNTER — Other Ambulatory Visit: Payer: Self-pay | Admitting: Cardiology

## 2023-01-12 ENCOUNTER — Other Ambulatory Visit: Payer: Self-pay

## 2023-01-12 DIAGNOSIS — Z79899 Other long term (current) drug therapy: Secondary | ICD-10-CM

## 2023-06-06 ENCOUNTER — Ambulatory Visit: Admitting: Cardiology

## 2023-06-15 ENCOUNTER — Other Ambulatory Visit: Payer: Self-pay | Admitting: Student

## 2023-06-18 ENCOUNTER — Other Ambulatory Visit: Payer: Self-pay | Admitting: Student

## 2023-07-20 ENCOUNTER — Ambulatory Visit: Attending: Student | Admitting: Student

## 2023-07-20 NOTE — Progress Notes (Deleted)
   Cardiology Office Note    Date:  07/20/2023  ID:  CHRISTIPHER RIEGER, DOB 1996-01-26, MRN [id number] Cardiologist: Teddie Favre, MD    History of Present Illness:    Benjamin Chang is a 28 y.o. male with past medical history of resistant hypertension (work-up for secondary causes unrevealing), GERD, chest pain (Coronary CT in 05/2019 showing calcium score of 0) and tobacco use who presents to the office today for annual follow-up.  He was last examined by Theotis Flake, PA in 04/2022 he reported occasional dizziness with positional changes but denied any anginal symptoms.  He was walking over 10-12,000 steps most days.  No changes were made to his medications and he was continued on amlodipine  10 mg daily, Coreg  6.25 mg twice daily, chlorthalidone  12.5 mg daily, spironolactone  25 mg daily and valsartan  320 mg daily.  Follow-up labs did show he was hypokalemic and he was started on K+ supplementation.   - BMET, CBC  ROS: ***  Studies Reviewed:   EKG: EKG is*** ordered today and demonstrates ***   EKG Interpretation Date/Time:    Ventricular Rate:    PR Interval:    QRS Duration:    QT Interval:    QTC Calculation:   R Axis:      Text Interpretation:         Echocardiogram: 10/2016 Study Conclusions   - Left ventricle: The cavity size was normal. Wall thickness was    increased increased in a pattern of mild to moderate LVH.    Systolic function was normal. The estimated ejection fraction was    in the range of 60% to 65%. Wall motion was normal; there were no    regional wall motion abnormalities. Left ventricular diastolic    function parameters were normal.  - Aortic valve: Valve area (VTI): 2.7 cm^2. Valve area (Vmax): 3.26    cm^2.  - Technically adequate study.   Coronary CTA: 05/2019 IMPRESSION: 1.Coronary artery calcium score 0 Agatston units, suggesting low risk for future cardiac events.   2.  No significant coronary disease noted.  Risk  Assessment/Calculations:   {Does this patient have ATRIAL FIBRILLATION?:[id number]} No BP recorded.  {Refresh Note OR Click here to enter BP  :1}***         Physical Exam:   VS:  There were no vitals taken for this visit.   Wt Readings from Last 3 Encounters:  04/10/22 244 lb 4 oz (110.8 kg)  11/01/21 239 lb (108.4 kg)  05/13/21 240 lb 9.6 oz (109.1 kg)     GEN: Well nourished, well developed in no acute distress NECK: No JVD; No carotid bruits CARDIAC: ***RRR, no murmurs, rubs, gallops RESPIRATORY:  Clear to auscultation without rales, wheezing or rhonchi  ABDOMEN: Appears non-distended. No obvious abdominal masses. EXTREMITIES: No clubbing or cyanosis. No edema.  Distal pedal pulses are 2+ bilaterally.   Assessment and Plan:   1. Resistant hypertension ***  2. Hypokalemia ***  3. History of chest pain ***  Signed, Dorma Gash, PA-C

## 2023-08-12 ENCOUNTER — Other Ambulatory Visit: Payer: Self-pay | Admitting: Student

## 2023-09-17 ENCOUNTER — Other Ambulatory Visit: Payer: Self-pay | Admitting: Cardiology

## 2023-10-16 ENCOUNTER — Ambulatory Visit: Attending: Cardiology | Admitting: Cardiology

## 2023-10-16 ENCOUNTER — Encounter: Payer: Self-pay | Admitting: Cardiology

## 2023-10-16 VITALS — BP 134/72 | HR 85 | Ht 75.0 in | Wt 254.0 lb

## 2023-10-16 DIAGNOSIS — I1A0 Resistant hypertension: Secondary | ICD-10-CM | POA: Diagnosis not present

## 2023-10-16 DIAGNOSIS — E781 Pure hyperglyceridemia: Secondary | ICD-10-CM

## 2023-10-16 DIAGNOSIS — I10 Essential (primary) hypertension: Secondary | ICD-10-CM

## 2023-10-16 MED ORDER — CARVEDILOL 6.25 MG PO TABS: 6.2500 mg | ORAL_TABLET | Freq: Two times a day (BID) | ORAL | 3 refills | Status: AC

## 2023-10-16 MED ORDER — AMLODIPINE BESYLATE 10 MG PO TABS: 10.0000 mg | ORAL_TABLET | Freq: Every day | ORAL | 3 refills | Status: AC

## 2023-10-16 MED ORDER — VALSARTAN 320 MG PO TABS: 320.0000 mg | ORAL_TABLET | Freq: Every day | ORAL | 3 refills | Status: AC

## 2023-10-16 MED ORDER — CHLORTHALIDONE 25 MG PO TABS
ORAL_TABLET | ORAL | 3 refills | Status: AC
Start: 2023-10-16 — End: ?

## 2023-10-16 MED ORDER — SPIRONOLACTONE 25 MG PO TABS: 25.0000 mg | ORAL_TABLET | Freq: Every day | ORAL | 3 refills | Status: DC

## 2023-10-16 NOTE — Patient Instructions (Signed)
 Medication Instructions:  Your physician recommends that you continue on your current medications as directed. Please refer to the Current Medication list given to you today.   Labwork: None today  Testing/Procedures: None today  Follow-Up: 1 year  Any Other Special Instructions Will Be Listed Below (If Applicable).  If you need a refill on your cardiac medications before your next appointment, please call your pharmacy.

## 2023-10-16 NOTE — Progress Notes (Signed)
    Cardiology Office Note  Date: 10/16/2023   ID: KENNA KIRN, DOB [birth year], MRN [id number]  History of Present Illness: Benjamin Chang is a 28 y.o. male last seen in the office by Ms. Parthenia RIGGERS in March 2024, I reviewed her note.  He is here today for a routine visit.  Reports no interval exertional symptoms or change in stamina.  Still working in Chiropractor, Administrator, sports.  He is a new father, daughter was born in August.  Medications reviewed.  He reports compliance with antihypertensive therapy, refills provided.  I did review his lab work from last visit with PCP.  Lipid panel showed significant hypertriglyceridemia at 783, HDL was 17 and LDLs 42.  TSH normal.  Discussed this today.  I reviewed his ECG today which shows normal sinus rhythm.  Physical Exam: VS:  BP 134/72 (BP Location: Right Arm, Cuff Size: Large)   Pulse 85   Ht 6' 3 (1.905 m)   Wt 254 lb (115.2 kg)   SpO2 95%   BMI 31.75 kg/m , BMI Body mass index is 31.75 kg/m.  Wt Readings from Last 3 Encounters:  10/16/23 254 lb (115.2 kg)  04/10/22 244 lb 4 oz (110.8 kg)  11/01/21 239 lb (108.4 kg)    General: Patient appears comfortable at rest. HEENT: Conjunctiva and lids normal. Neck: Supple, no elevated JVP or carotid bruits. Lungs: Clear to auscultation, nonlabored breathing at rest. Cardiac: Regular rate and rhythm, no S3 or significant systolic murmur, no pericardial rub. Extremities: No pitting edema.  ECG:  An ECG dated 05/13/2021 was personally reviewed today and demonstrated:  Sinus rhythm.  Labwork:  December 2024: BUN 15, creatinine 0.82, GFR 124, potassium 3.6, AST 26, ALT 50, cholesterol 168, triglycerides 783, HDL 17, LDL 42, TSH 2.21  Other Studies Reviewed Today:  No interval cardiac testing for review today.  Assessment and Plan:  1.  Resistant hypertension with previous negative workup for secondary causes.  He is on multimodal therapy.  Blood pressure control reasonable  today.  He reports compliance with therapy.  Continue Norvasc  10 mg daily, Coreg  6.25 mg twice daily, chlorthalidone  25 mg daily, KCl 20 mill equivalents daily, Aldactone  25 mg daily, and Diovan  320 mg daily.  Refills provided.  2.  Coronary calcium score of 0 with no significant CAD by coronary CTA in 2021.  3.  Hypertriglyceridemia, low HDL with concern for metabolic syndrome.  Also at risk for diabetes.  We discussed management of triglycerides and risk for pancreatitis if this is not controlled.  Recommended that he start omega-3 supplements 1000 mg twice daily for now and follow-up lipids with his PCP.  Disposition:  Follow up 1 year.  Signed, Benjamin Chang, M.D., F.A.C.C. [facility]

## 2023-11-21 ENCOUNTER — Other Ambulatory Visit: Payer: Self-pay | Admitting: Student

## 2024-03-05 ENCOUNTER — Encounter: Payer: Self-pay | Admitting: Gastroenterology

## 2024-03-12 NOTE — Progress Notes (Unsigned)
 "  GI Office Note    Referring Provider: Trudy Vaughn FALCON, MD Primary Care Physician:  Trudy Vaughn FALCON, MD  Primary Gastroenterologist: Lamar HERO.Rourk, MD  Chief Complaint   Chief Complaint  Patient presents with   Gastroesophageal Reflux    Acid reflux is giving him chest pains. Blood in his stools and fatty liver    History of Present Illness   Benjamin Chang is a 29 y.o. male presenting today at the request of Trudy Vaughn FALCON, MD for GERD, fatty liver, and hematochezia.  Per review of his referral paperwork he recently had an abnormal ALT and denied any EtOH use.  His hypertension was noted to be controlled and he was vaping.  Also with a history of hyperlipidemia with an LDL of 160 and a family history of CAD/MI/CVA and also hypertriglyceridemia and himself with last labs of a 22.  Also noted intermittent chest pain and follows with Dr. Debera every 6 months.  He also has vitamin D deficiency.  He had complaints of reflux that was bothering him if he missed a dose of his medication.  He also had reported the presence of blood in his stool  Labs completed 01/28/2024: Hemoglobin 16.3, platelets 304, albumin 5.1, alk phos 94, ALT mildly elevated at 59, AST 40, sodium 143, T. bili 0.3, A1c 5.5, TSH 2.764, vitamin D19.6.  Given his labs in December and ultrasound as well as hepatitis C labs ordered and he was started on daily vitamin D supplementation.  Complete abdominal ultrasound 02/27/2024: Hepatomegaly with liver measuring 17 cm.  Increased echogenicity likely secondary to fatty liver.  No biliary ductal dilation.  CBD measuring 3 mm.  No evidence of cholelithiasis or choledocholithiasis.  No liver megaly.  HCV nonreactive.  Today:  Discussed the use of AI scribe software for clinical note transcription with the patient, who gave verbal consent to proceed.  Chronic acid reflux symptoms began at age 76-19, characterized by heartburn and nocturnal chest discomfort,  especially when lying down. Symptoms are well controlled with daily omeprazole  20 mg, but worsen significantly if doses are missed for more than two days. Dietary triggers include pizza and tomato sauce; alcohol  and coffee do not exacerbate symptoms. Occasional use of antacids such as Tums provides minimal relief. No pain or difficulty swallowing, significant nausea, or vomiting. Belching occurs during episodes. Concern expressed regarding long-term PPI use due to family history of dementia and Alzheimer's disease.  Intermittent nocturnal chest pain occurs, sometimes severe and lasting up to a couple of hours, with residual soreness the following day. Frequency has decreased over the past 3-4 years. Chest pain is associated with shortness of breath, occurs at night while lying in bed, and is not triggered by exertion or activity. Post-episode soreness sometimes necessitates time off work. Prior cardiac workup reportedly normal.  Intermittent rectal bleeding has occurred for several years, beginning around the time omeprazole  was started. Most recent episode was two weeks ago, lasting 2-3 days. Bleeding described as bright red blood in the toilet and on the stool, sometimes in large amounts, occurring every 2-4 months. No blood dripping observed. Bowel movements occur 2-3 times daily, sometimes large or firm, with occasional straining. No black or tarry stools. No associated diarrhea or other gastrointestinal symptoms. 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  Recent ultrasound and laboratory studies reportedly showed a mildly enlarged liver and mildly elevated ALT. Elevated cholesterol and triglycerides noted. No significant alcohol  use. Interest in dietary changes, including  the Mediterranean diet. No symptoms suggestive of advanced liver disease.  History of hypertension and hyperlipidemia, currently on multiple blood pressure medications. Currently vapes  nicotine, previously smoked cigarettes. Alcohol  use is occasional, not daily.       Wt Readings from Last 6 Encounters:  03/13/24 261 lb 9.6 oz (118.7 kg)  10/16/23 254 lb (115.2 kg)  04/10/22 244 lb 4 oz (110.8 kg)  11/01/21 239 lb (108.4 kg)  05/13/21 240 lb 9.6 oz (109.1 kg)  10/22/20 238 lb (108 kg)    Body mass index is 33.59 kg/m.  Current Outpatient Medications  Medication Sig Dispense Refill   amLODipine  (NORVASC ) 10 MG tablet Take 1 tablet (10 mg total) by mouth daily. 90 tablet 3   carvedilol  (COREG ) 6.25 MG tablet Take 1 tablet (6.25 mg total) by mouth 2 (two) times daily with a meal. 180 tablet 3   cetirizine (ZYRTEC) 10 MG tablet Take 10 mg by mouth daily. (Patient taking differently: Take 10 mg by mouth daily. PRN)     chlorthalidone  (HYGROTON ) 25 MG tablet TAKE 1/2 TABLET BY MOUTH DAILY. REPLACES HYDROCHLOROTHIAZIDE  45 tablet 3   omeprazole  (PRILOSEC) 20 MG capsule TAKE 1 CAPSULE BY MOUTH DAILY 90 capsule 0   potassium chloride  SA (KLOR-CON  M) 20 MEQ tablet TAKE 1 TABLET(20 MEQ) BY MOUTH DAILY 90 tablet 3   rosuvastatin (CRESTOR) 5 MG tablet Take 5 mg by mouth daily.     spironolactone  (ALDACTONE ) 25 MG tablet TAKE 1 TABLET(25 MG) BY MOUTH DAILY 90 tablet 3   valsartan  (DIOVAN ) 320 MG tablet Take 1 tablet (320 mg total) by mouth daily. 90 tablet 3   No current facility-administered medications for this visit.    Past Medical History:  Diagnosis Date   Atypical chest pain    Essential hypertension    GERD (gastroesophageal reflux disease)     Past Surgical History:  Procedure Laterality Date   LAPAROSCOPIC APPENDECTOMY N/A 11/04/2015   Procedure: APPENDECTOMY LAPAROSCOPIC;  Surgeon: Selinda Artist Moats, MD;  Location: [facility];  Service: General;  Laterality: N/A;   TONSILLECTOMY      Family History  Problem Relation Age of Onset   Heart disease Other    Heart disease Maternal Grandmother    Hypertension Maternal Grandmother    Heart attack Paternal  Grandfather    Diabetes Paternal Grandfather    Hypertension Paternal Grandfather    Dementia Paternal Grandfather    Hypertension Mother    Heart disease Mother    Hypertension Father    Heart disease Father    Aneurysm Maternal Grandfather    Hypertension Paternal Grandmother     Allergies as of 03/13/2024 - Review Complete 03/13/2024  Allergen Reaction Noted   Lisinopril Swelling 05/17/2016    Social History   Socioeconomic History   Marital status: Married    Spouse name: Not on file   Number of children: Not on file   Years of education: Not on file   Highest education level: Not on file  Occupational History   Occupation: Holiday Representative  Tobacco Use   Smoking status: Former    Current packs/day: 0.50    Types: Cigarettes   Smokeless tobacco: Never  Vaping Use   Vaping status: Every Day  Substance and Sexual Activity   Alcohol  use: Yes    Comment: occ   Drug use: No   Sexual activity: Yes    Birth control/protection: None  Other Topics Concern   Not on file  Social History Narrative   **  Merged History Encounter **       Social Drivers of Health   Tobacco Use: Medium Risk (03/13/2024)   Patient History    Smoking Tobacco Use: Former    Smokeless Tobacco Use: Never    Passive Exposure: Not on Actuary Strain: Not on file  Food Insecurity: Not on file  Transportation Needs: Not on file  Physical Activity: Not on file  Stress: Not on file  Social Connections: Not on file  Intimate Partner Violence: Not on file  Depression (EYV7-0): Not on file  Alcohol  Screen: Not on file  Housing: Not on file  Utilities: Not on file  Health Literacy: Not on file    Review of Systems   Gen: Denies any fever, chills, fatigue, weight loss, lack of appetite.  CV: + chest pain. Denies heart palpitations, peripheral edema, syncope.  Resp: Denies shortness of breath at rest or with exertion. Denies wheezing or cough.  GI: see HPI GU : Denies urinary  burning, urinary frequency, urinary hesitancy MS: Denies joint pain, muscle weakness, cramps, or limitation of movement.  Derm: Denies rash, itching, dry skin Psych: Denies depression, anxiety, memory loss, and confusion Heme: Denies bruising, bleeding, and enlarged lymph nodes.  Physical Exam   BP 121/80 (BP Location: Right Arm, Patient Position: Sitting, Cuff Size: Large)   Pulse 73   Temp 97.7 F (36.5 C) (Temporal)   Ht 6' 2 (1.88 m)   Wt 261 lb 9.6 oz (118.7 kg)   BMI 33.59 kg/m   General:   Alert and oriented. Pleasant and cooperative. Well-nourished and well-developed.  Head:  Normocephalic and atraumatic. Eyes:  Without icterus, sclera clear and conjunctiva pink.  Ears:  Normal auditory acuity. Mouth:  No deformity or lesions, oral mucosa pink.  Lungs:  Clear to auscultation bilaterally. No wheezes, rales, or rhonchi. No distress.  Heart:  S1, S2 present without murmurs appreciated.  Abdomen:  +BS, soft, non-distended. Ttp to epigastrium.  Mild palpable liver border into the epigastrium. No splenomegaly. No guarding or rebound. No masses appreciated.  Rectal:  deferred - plan for next visit.  Msk:  Symmetrical without gross deformities. Normal posture. Extremities:  Without edema. Neurologic:  Alert and  oriented x4;  grossly normal neurologically. Skin:  Intact without significant lesions or rashes. Psych:  Alert and cooperative. Normal mood and affect.  Assessment & Plan   Benjamin Chang is a 29 y.o. male with a history of HTN, GERD, atypical chest pain, hyperlipidemia and hypercholesteremia presenting today for evaluation of reflux, elevated LFTs, and hematochezia.    Gastroesophageal reflux disease Chronic, longstanding GERD with intermittently severe nocturnal chest pain and esophageal spasm, currently well controlled on omeprazole  but with breakthrough symptoms when doses are missed. Epigastric tenderness on exam today. Possible esophagitis and need to rule out H.  pylori infection. Risks of long-term PPI use, including potential associations with dementia, osteoporosis, and gastrointestinal infections, were discussed. - Increased omeprazole  to 40 mg daily. - Prescribed sucralfate  for episodic use at onset of severe symptoms. - Ordered upper endoscopy to evaluate esophageal and gastric mucosa, assess for inflammation, and obtain biopsies including H. pylori testing. - Provided education regarding risks and benefits of long-term PPI therapy. - Discussed dietary triggers and recommended avoidance of late meals and reflux-provoking foods. - Advised to avoid coffee after 2-3 pm due to reflux. - Scheduled follow-up in approximately 8 weeks, or sooner if symptoms worsen.  Metabolic dysfunction-associated steatotic liver disease, elevated ALT Mildly enlarged liver with  increased echotexture on US  and mildly elevated ALT, consistent with MASLD, without evidence of cirrhosis or advanced fibrosis. Risk factors include hypertension, hyperlipidemia, and family history. Emphasized importance of lifestyle modification and alcohol  avoidance. - Ordered fasting blood work to assess degree of fibrosis and rule out other causes of liver enzyme elevation, including autoimmune etiologies, viral etiologies.  - Provided education on Mediterranean diet and regular exercise with resistance training. - Provided handout with Mediterranean diet recommendations. - Recommended black coffee (2-3 cups daily) as adjunctive therapy for fatty liver, with caution regarding reflux symptoms. - Advised strict avoidance of alcohol  until further assessment of liver fibrosis. - Will consider specialized liver stiffness scan (Fibroscan) if indicated by blood work results. - Scheduled follow-up after laboratory results and/or after endoscopy.  Rectal bleeding, likely secondary to hemorrhoids Intermittent, bright red rectal bleeding over several years, most consistent with internal hemorrhoids.  Differential includes diverticulosis, polyps, angioectasias, and less likely inflammatory bowel disease or malignancy. Family history of diverticulitis noted. No reports of family history of colon cancer. Conditional evaluation for more serious pathology discussed if bleeding worsens or new symptoms develop. - Recommended trial of over-the-counter hemorrhoid cream with applicator for symptomatic relief, including for internal hemorrhoids. - Provided education regarding application techniques and importance of fiber and hydration to reduce stool firmness and minimize trauma to hemorrhoidal tissue. - Deferred rectal exam today per his preference; recommended rectal exam at next visit if bleeding persists or recurs. - Discussed indications for colonoscopy if bleeding becomes more frequent, severe, or associated with concerning symptoms (change in bowel ha bits, persistent bleeding >24 hours, or difficulty with defecation).      Follow up   Follow up 8 weeks.    Charmaine Melia, MSN, FNP-BC, AGACNP-BC [facility] "

## 2024-03-13 ENCOUNTER — Ambulatory Visit: Admitting: Gastroenterology

## 2024-03-13 ENCOUNTER — Telehealth: Payer: Self-pay | Admitting: *Deleted

## 2024-03-13 ENCOUNTER — Encounter: Payer: Self-pay | Admitting: Gastroenterology

## 2024-03-13 VITALS — BP 121/80 | HR 73 | Temp 97.7°F | Ht 74.0 in | Wt 261.6 lb

## 2024-03-13 DIAGNOSIS — K760 Fatty (change of) liver, not elsewhere classified: Secondary | ICD-10-CM | POA: Diagnosis not present

## 2024-03-13 DIAGNOSIS — K648 Other hemorrhoids: Secondary | ICD-10-CM

## 2024-03-13 DIAGNOSIS — R7401 Elevation of levels of liver transaminase levels: Secondary | ICD-10-CM | POA: Diagnosis not present

## 2024-03-13 DIAGNOSIS — K219 Gastro-esophageal reflux disease without esophagitis: Secondary | ICD-10-CM

## 2024-03-13 DIAGNOSIS — K7581 Nonalcoholic steatohepatitis (NASH): Secondary | ICD-10-CM

## 2024-03-13 DIAGNOSIS — K921 Melena: Secondary | ICD-10-CM

## 2024-03-13 DIAGNOSIS — Z8379 Family history of other diseases of the digestive system: Secondary | ICD-10-CM | POA: Diagnosis not present

## 2024-03-13 MED ORDER — SUCRALFATE 1 G PO TABS: 1.0000 g | ORAL_TABLET | Freq: Two times a day (BID) | ORAL | 0 refills | Status: AC | PRN

## 2024-03-13 MED ORDER — OMEPRAZOLE 40 MG PO CPDR: 40.0000 mg | DELAYED_RELEASE_CAPSULE | Freq: Every day | ORAL | 2 refills | Status: AC

## 2024-03-13 NOTE — Patient Instructions (Addendum)
" °  VISIT SUMMARY: During your visit, we discussed your chronic acid reflux, liver health, and rectal bleeding. We adjusted your medications, recommended dietary changes, and scheduled follow-up tests and appointments.  YOUR PLAN: GASTROESOPHAGEAL REFLUX DISEASE (GERD): -Increase omeprazole  to 40 mg daily. Prescription sent to pharmacy for you.  -Use sucralfate  for severe symptoms. Prescription sent to pharmacy today. Take 1 tablet or dissolve in 1-2 oz of water and drink as a slurry for severe night time symptoms.  -An upper endoscopy has been ordered to check for inflammation and test for H. pylori. - Follow a GERD diet:  Avoid fried, fatty, greasy, spicy, citrus foods. Avoid/limit caffeine and carbonated beverages. Avoid chocolate. Try eating 4-6 small meals a day rather than 3 large meals. Do not eat within 3 hours of laying down. Prop head of bed up on wood or bricks to create a 6 inch incline. -Avoid coffee after 2-3 pm.  METABOLIC DYSFUNCTION-ASSOCIATED STEATOTIC LIVER DISEASE (MASLD): You have a mildly enlarged liver and elevated liver enzymes, likely due to MASLD. This condition is associated with your hypertension and hyperlipidemia and weight.  -Fasting blood work has been ordered to assess liver fibrosis and rule out other causes of liver enzyme elevation. -Follow a Mediterranean diet and engage in regular exercise with resistance training. I attached a handout regarding diet and exercise.  -Avoid alcohol  until further assessment of liver fibrosis. -Consider a specialized liver stiffness scan if indicated by blood work results. -Follow up after laboratory results and/or after endoscopy.  RECTAL BLEEDING, LIKELY SECONDARY TO HEMORRHOIDS: You have intermittent bright red rectal bleeding, most likely due to internal hemorrhoids. We need to do an exam in near future.  -Use over-the-counter hemorrhoid cream 1-2 times a day for 5 days with applicator for symptomatic relief then as  needed.  -Increase fiber intake and stay hydrated to reduce stool firmness. -Consider a colonoscopy if bleeding becomes more frequent, severe, or associated with concerning symptoms.  Follow up in approximately 8 weeks, or sooner if symptoms worsen.   "

## 2024-03-13 NOTE — Telephone Encounter (Signed)
LMOVM to return call  EGD w/Dr.Rourk, asa 2

## 2024-04-07 ENCOUNTER — Ambulatory Visit: Admitting: Gastroenterology
